# Patient Record
Sex: Female | Born: 1990 | Race: White | Hispanic: No | Marital: Married | Smoking: Never smoker
Health system: Southern US, Community
[De-identification: ages and names within clinical notes are randomized; demographics above are authoritative.]

## PROBLEM LIST (undated history)

## (undated) ENCOUNTER — Inpatient Hospital Stay (HOSPITAL_COMMUNITY): Payer: Self-pay

## (undated) DIAGNOSIS — E662 Morbid (severe) obesity with alveolar hypoventilation: Secondary | ICD-10-CM

## (undated) DIAGNOSIS — K859 Acute pancreatitis without necrosis or infection, unspecified: Secondary | ICD-10-CM

## (undated) DIAGNOSIS — K8050 Calculus of bile duct without cholangitis or cholecystitis without obstruction: Secondary | ICD-10-CM

## (undated) DIAGNOSIS — N390 Urinary tract infection, site not specified: Secondary | ICD-10-CM

## (undated) DIAGNOSIS — R202 Paresthesia of skin: Secondary | ICD-10-CM

## (undated) DIAGNOSIS — K863 Pseudocyst of pancreas: Secondary | ICD-10-CM

## (undated) DIAGNOSIS — E559 Vitamin D deficiency, unspecified: Secondary | ICD-10-CM

## (undated) DIAGNOSIS — F329 Major depressive disorder, single episode, unspecified: Secondary | ICD-10-CM

## (undated) DIAGNOSIS — N183 Chronic kidney disease, stage 3 (moderate): Secondary | ICD-10-CM

## (undated) DIAGNOSIS — F32A Depression, unspecified: Secondary | ICD-10-CM

## (undated) DIAGNOSIS — E119 Type 2 diabetes mellitus without complications: Secondary | ICD-10-CM

## (undated) DIAGNOSIS — I10 Essential (primary) hypertension: Secondary | ICD-10-CM

## (undated) HISTORY — PX: CHOLECYSTECTOMY: SHX55

## (undated) HISTORY — DX: Pseudocyst of pancreas: K86.3

## (undated) HISTORY — DX: Type 2 diabetes mellitus without complications: E11.9

## (undated) HISTORY — DX: Paresthesia of skin: R20.2

## (undated) HISTORY — DX: Vitamin D deficiency, unspecified: E55.9

## (undated) HISTORY — DX: Essential (primary) hypertension: I10

---
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
## 2014-02-17 ENCOUNTER — Encounter (HOSPITAL_COMMUNITY): Payer: Self-pay | Admitting: Pharmacy Technician

## 2014-02-26 ENCOUNTER — Encounter (HOSPITAL_COMMUNITY)
Admission: RE | Admit: 2014-02-26 | Discharge: 2014-02-26 | Disposition: A | Discharge status: Home / Self Care | Payer: Medicaid Other | Source: Ambulatory Visit | Attending: Oral Surgery | Admitting: Oral Surgery

## 2014-02-26 ENCOUNTER — Encounter (HOSPITAL_COMMUNITY): Payer: Self-pay

## 2014-02-26 DIAGNOSIS — Z01812 Encounter for preprocedural laboratory examination: Secondary | ICD-10-CM | POA: Insufficient documentation

## 2014-02-26 DIAGNOSIS — Z01818 Encounter for other preprocedural examination: Secondary | ICD-10-CM | POA: Diagnosis not present

## 2014-02-26 LAB — CBC
HEMATOCRIT: 40.8 % (ref 36.0–46.0)
Hemoglobin: 13.6 g/dL (ref 12.0–15.0)
MCH: 28.7 pg (ref 26.0–34.0)
MCHC: 33.3 g/dL (ref 30.0–36.0)
MCV: 86.1 fL (ref 78.0–100.0)
Platelets: 225 10*3/uL (ref 150–400)
RBC: 4.74 MIL/uL (ref 3.87–5.11)
RDW: 13.9 % (ref 11.5–15.5)
WBC: 7.6 10*3/uL (ref 4.0–10.5)

## 2014-02-26 LAB — HCG, SERUM, QUALITATIVE: Preg, Serum: NEGATIVE

## 2014-02-26 NOTE — H&P (Signed)
HISTORY AND PHYSICAL  Sharon BeltonKayla Duncan is a 23 y.o. female patient with CC: painful wisdom teeth  No diagnosis found.  No past medical history on file.  No current facility-administered medications for this encounter.   No current outpatient prescriptions on file.   No Known Allergies Active Problems:   * No active hospital problems. *  Vitals: There were no vitals taken for this visit. Lab results: Results for orders placed during the hospital encounter of 02/26/14 (from the past 24 hour(s))  HCG, SERUM, QUALITATIVE     Status: None   Collection Time    02/26/14  9:22 AM      Result Value Ref Range   Preg, Serum NEGATIVE  NEGATIVE  CBC     Status: None   Collection Time    02/26/14  9:22 AM      Result Value Ref Range   WBC 7.6  4.0 - 10.5 K/uL   RBC 4.74  3.87 - 5.11 MIL/uL   Hemoglobin 13.6  12.0 - 15.0 g/dL   HCT 16.140.8  09.636.0 - 04.546.0 %   MCV 86.1  78.0 - 100.0 fL   MCH 28.7  26.0 - 34.0 pg   MCHC 33.3  30.0 - 36.0 g/dL   RDW 40.913.9  81.111.5 - 91.415.5 %   Platelets 225  150 - 400 K/uL   Radiology Results: No results found. General appearance: alert, cooperative and morbidly obese Head: Normocephalic, without obvious abnormality, atraumatic Eyes: negative Nose: Nares normal. Septum midline. Mucosa normal. No drainage or sinus tenderness. Throat: lips, mucosa, and tongue normal; teeth and gums normal and impacted teeth # 1, 16, 17, 32 Neck: no adenopathy, supple, symmetrical, trachea midline and thyroid not enlarged, symmetric, no tenderness/mass/nodules Resp: clear to auscultation bilaterally Cardio: regular rate and rhythm, S1, S2 normal, no murmur, click, rub or gallop  Assessment:Impacted teeth 1, 16, 17, 32. Morbid obesity   Plan:Extraction impacted teeth 1, 16, 17, 32. GA Day surgery.   Yanely Mast M 02/26/2014

## 2014-03-02 MED ORDER — DEXTROSE 5 % IV SOLN
3.0000 g | INTRAVENOUS | Status: AC
  Administered 2014-03-03: 3 g via INTRAVENOUS
  Filled 2014-03-02: qty 3000

## 2014-03-03 ENCOUNTER — Encounter (HOSPITAL_COMMUNITY): Payer: Medicaid Other | Admitting: Anesthesiology

## 2014-03-03 ENCOUNTER — Ambulatory Visit (HOSPITAL_COMMUNITY)
Admission: RE | Admit: 2014-03-03 | Discharge: 2014-03-03 | Disposition: A | Discharge status: Home / Self Care | Payer: Medicaid Other | Source: Ambulatory Visit | Attending: Oral Surgery | Admitting: Oral Surgery

## 2014-03-03 ENCOUNTER — Encounter (HOSPITAL_COMMUNITY): Payer: Self-pay | Admitting: *Deleted

## 2014-03-03 ENCOUNTER — Encounter (HOSPITAL_COMMUNITY)
Admission: RE | Disposition: A | Discharge status: Home / Self Care | Payer: Self-pay | Source: Ambulatory Visit | Attending: Oral Surgery

## 2014-03-03 ENCOUNTER — Ambulatory Visit (HOSPITAL_COMMUNITY): Payer: Medicaid Other | Admitting: Anesthesiology

## 2014-03-03 DIAGNOSIS — K006 Disturbances in tooth eruption: Secondary | ICD-10-CM | POA: Insufficient documentation

## 2014-03-03 DIAGNOSIS — K011 Impacted teeth: Secondary | ICD-10-CM

## 2014-03-03 HISTORY — PX: TOOTH EXTRACTION: SHX859

## 2014-03-03 SURGERY — EXTRACTION, TOOTH, MOLAR
Anesthesia: General | Site: Mouth

## 2014-03-03 MED ORDER — MIDAZOLAM HCL 5 MG/5ML IJ SOLN
INTRAMUSCULAR | Status: DC | PRN
  Administered 2014-03-03: 2 mg via INTRAVENOUS

## 2014-03-03 MED ORDER — LACTATED RINGERS IV SOLN
INTRAVENOUS | Status: DC
  Administered 2014-03-03: 50 mL/h via INTRAVENOUS

## 2014-03-03 MED ORDER — ARTIFICIAL TEARS OP OINT
TOPICAL_OINTMENT | OPHTHALMIC | Status: DC | PRN
  Administered 2014-03-03: 1 via OPHTHALMIC

## 2014-03-03 MED ORDER — OXYCODONE-ACETAMINOPHEN 5-325 MG PO TABS: 1.0000 | ORAL_TABLET | ORAL | Status: DC | PRN

## 2014-03-03 MED ORDER — LACTATED RINGERS IV SOLN
INTRAVENOUS | Status: DC | PRN
  Administered 2014-03-03: 10:00:00 via INTRAVENOUS

## 2014-03-03 MED ORDER — PROPOFOL 10 MG/ML IV BOLUS
INTRAVENOUS | Status: AC
  Filled 2014-03-03: qty 20

## 2014-03-03 MED ORDER — GLYCOPYRROLATE 0.2 MG/ML IJ SOLN
INTRAMUSCULAR | Status: AC
  Filled 2014-03-03: qty 2

## 2014-03-03 MED ORDER — DEXAMETHASONE SODIUM PHOSPHATE 4 MG/ML IJ SOLN
INTRAMUSCULAR | Status: AC
  Filled 2014-03-03: qty 2

## 2014-03-03 MED ORDER — FENTANYL CITRATE 0.05 MG/ML IJ SOLN
INTRAMUSCULAR | Status: DC | PRN
  Administered 2014-03-03: 50 ug via INTRAVENOUS
  Administered 2014-03-03: 100 ug via INTRAVENOUS
  Administered 2014-03-03: 50 ug via INTRAVENOUS

## 2014-03-03 MED ORDER — ARTIFICIAL TEARS OP OINT
TOPICAL_OINTMENT | OPHTHALMIC | Status: AC
  Filled 2014-03-03: qty 3.5

## 2014-03-03 MED ORDER — SUCCINYLCHOLINE CHLORIDE 20 MG/ML IJ SOLN
INTRAMUSCULAR | Status: DC | PRN
  Administered 2014-03-03: 100 mg via INTRAVENOUS

## 2014-03-03 MED ORDER — ALBUTEROL SULFATE HFA 108 (90 BASE) MCG/ACT IN AERS
INHALATION_SPRAY | RESPIRATORY_TRACT | Status: AC
  Filled 2014-03-03: qty 6.7

## 2014-03-03 MED ORDER — 0.9 % SODIUM CHLORIDE (POUR BTL) OPTIME
TOPICAL | Status: DC | PRN
  Administered 2014-03-03: 1000 mL

## 2014-03-03 MED ORDER — LIDOCAINE-EPINEPHRINE 2 %-1:100000 IJ SOLN
INTRAMUSCULAR | Status: DC | PRN
  Administered 2014-03-03: 20 mL via INTRADERMAL

## 2014-03-03 MED ORDER — ALBUTEROL SULFATE HFA 108 (90 BASE) MCG/ACT IN AERS
INHALATION_SPRAY | RESPIRATORY_TRACT | Status: DC | PRN
  Administered 2014-03-03: 6 via RESPIRATORY_TRACT

## 2014-03-03 MED ORDER — HYDROMORPHONE HCL PF 1 MG/ML IJ SOLN: 0.2500 mg | INTRAMUSCULAR | Status: DC | PRN

## 2014-03-03 MED ORDER — LIDOCAINE HCL (CARDIAC) 20 MG/ML IV SOLN
INTRAVENOUS | Status: DC | PRN
  Administered 2014-03-03: 80 mg via INTRAVENOUS

## 2014-03-03 MED ORDER — PROPOFOL 10 MG/ML IV BOLUS
INTRAVENOUS | Status: DC | PRN
  Administered 2014-03-03: 170 mg via INTRAVENOUS
  Administered 2014-03-03: 30 mg via INTRAVENOUS

## 2014-03-03 MED ORDER — LIDOCAINE HCL (CARDIAC) 20 MG/ML IV SOLN
INTRAVENOUS | Status: AC
  Filled 2014-03-03: qty 5

## 2014-03-03 MED ORDER — FENTANYL CITRATE 0.05 MG/ML IJ SOLN
INTRAMUSCULAR | Status: AC
  Filled 2014-03-03: qty 5

## 2014-03-03 MED ORDER — SODIUM CHLORIDE 0.9 % IR SOLN
Status: DC | PRN
  Administered 2014-03-03: 1000 mL

## 2014-03-03 MED ORDER — ONDANSETRON HCL 4 MG PO TABS: 4.0000 mg | ORAL_TABLET | Freq: Three times a day (TID) | ORAL | Status: DC | PRN

## 2014-03-03 MED ORDER — MIDAZOLAM HCL 2 MG/2ML IJ SOLN
INTRAMUSCULAR | Status: AC
  Filled 2014-03-03: qty 2

## 2014-03-03 MED ORDER — ONDANSETRON HCL 4 MG/2ML IJ SOLN
INTRAMUSCULAR | Status: DC | PRN
  Administered 2014-03-03: 4 mg via INTRAVENOUS

## 2014-03-03 MED ORDER — DEXAMETHASONE SODIUM PHOSPHATE 4 MG/ML IJ SOLN
INTRAMUSCULAR | Status: DC | PRN
  Administered 2014-03-03: 8 mg via INTRAVENOUS

## 2014-03-03 MED ORDER — ONDANSETRON HCL 4 MG/2ML IJ SOLN
INTRAMUSCULAR | Status: AC
  Filled 2014-03-03: qty 2

## 2014-03-03 MED ORDER — OXYMETAZOLINE HCL 0.05 % NA SOLN
NASAL | Status: DC | PRN
  Administered 2014-03-03: 1 via NASAL

## 2014-03-03 MED ORDER — ROCURONIUM BROMIDE 50 MG/5ML IV SOLN
INTRAVENOUS | Status: AC
  Filled 2014-03-03: qty 1

## 2014-03-03 SURGICAL SUPPLY — 32 items
BLADE 10 SAFETY STRL DISP (BLADE) ×3 IMPLANT
BUR CROSS CUT (BURR) ×2
BUR CROSS CUT FISSURE 1.6 (BURR) ×2 IMPLANT
BUR CROSS CUT FISSURE 1.6MM (BURR) ×1
BUR SRG MED 1.6XXCUT FSSR (BURR) ×1 IMPLANT
BURR SRG MED 1.6XXCUT FSSR (BURR) ×1
CANISTER SUCTION 2500CC (MISCELLANEOUS) ×3 IMPLANT
COVER SURGICAL LIGHT HANDLE (MISCELLANEOUS) ×3 IMPLANT
GAUZE PACKING FOLDED 2  STR (GAUZE/BANDAGES/DRESSINGS) ×2
GAUZE PACKING FOLDED 2 STR (GAUZE/BANDAGES/DRESSINGS) ×1 IMPLANT
GAUZE SPONGE 4X4 16PLY XRAY LF (GAUZE/BANDAGES/DRESSINGS) IMPLANT
GLOVE BIO SURGEON STRL SZ 6.5 (GLOVE) ×2 IMPLANT
GLOVE BIO SURGEON STRL SZ7.5 (GLOVE) ×3 IMPLANT
GLOVE BIO SURGEONS STRL SZ 6.5 (GLOVE) ×1
GLOVE BIOGEL PI IND STRL 7.0 (GLOVE) ×1 IMPLANT
GLOVE BIOGEL PI INDICATOR 7.0 (GLOVE) ×2
GOWN STRL REUS W/ TWL LRG LVL3 (GOWN DISPOSABLE) ×1 IMPLANT
GOWN STRL REUS W/ TWL XL LVL3 (GOWN DISPOSABLE) ×1 IMPLANT
GOWN STRL REUS W/TWL LRG LVL3 (GOWN DISPOSABLE) ×2
GOWN STRL REUS W/TWL XL LVL3 (GOWN DISPOSABLE) ×2
KIT BASIN OR (CUSTOM PROCEDURE TRAY) ×3 IMPLANT
KIT ROOM TURNOVER OR (KITS) ×3 IMPLANT
NEEDLE 22X1 1/2 (OR ONLY) (NEEDLE) ×3 IMPLANT
NS IRRIG 1000ML POUR BTL (IV SOLUTION) ×3 IMPLANT
PAD ARMBOARD 7.5X6 YLW CONV (MISCELLANEOUS) ×6 IMPLANT
SUT CHROMIC 3 0 PS 2 (SUTURE) ×6 IMPLANT
TOWEL OR 17X26 10 PK STRL BLUE (TOWEL DISPOSABLE) ×3 IMPLANT
TRAY ENT MC OR (CUSTOM PROCEDURE TRAY) ×3 IMPLANT
TUBE CONNECTING 12'X1/4 (SUCTIONS)
TUBE CONNECTING 12X1/4 (SUCTIONS) IMPLANT
TUBING IRRIGATION (MISCELLANEOUS) IMPLANT
YANKAUER SUCT BULB TIP NO VENT (SUCTIONS) ×3 IMPLANT

## 2014-03-03 NOTE — H&P (Signed)
H&P documentation  -History and Physical Reviewed  -Patient has been re-examined  -No change in the plan of care  [name]  

## 2014-03-03 NOTE — Op Note (Signed)
03/03/2014  10:57 AM  PATIENT:  Sharon Duncan  23 y.o. female  PRE-OPERATIVE DIAGNOSIS:  Impacted wisdom teeth 1, 16, 17, 32, morbid obesity  POST-OPERATIVE DIAGNOSIS:  SAME  PROCEDURE:  Procedure(s): EXTRACTION OF WISDOM TEETH 1, 16, 17, 32  SURGEON:  Surgeon(s): Sharon Duncan, DDS  ANESTHESIA:   local and general  EBL:  minimal  DRAINS: none   SPECIMEN:  No Specimen  COUNTS:  YES  PLAN OF CARE: Discharge to home after PACU  PATIENT DISPOSITION:  PACU - hemodynamically stable.   PROCEDURE DETAILS: Dictation # [id number]  Sharon LopesScott M. Shandi Godfrey, DMD 03/03/2014 10:57 AM

## 2014-03-03 NOTE — Anesthesia Postprocedure Evaluation (Signed)
  Anesthesia Post-op Note  Patient: Graciella BeltonKayla Galasso  Procedure(s) Performed: Procedure(s): EXTRACTION OF WISDOM TEETH (N/A)  Patient Location: PACU  Anesthesia Type:General  Level of Consciousness: awake  Airway and Oxygen Therapy: Patient Spontanous Breathing  Post-op Pain: mild  Post-op Assessment: Post-op Vital signs reviewed  Post-op Vital Signs: Reviewed  Last Vitals:  Filed Vitals:   03/03/14 1109  BP: 117/59  Pulse: 106  Temp: 36.8 C  Resp: 20    Complications: No apparent anesthesia complications

## 2014-03-03 NOTE — Transfer of Care (Signed)
Immediate Anesthesia Transfer of Care Note  Patient: Sharon Duncan  Procedure(s) Performed: Procedure(s): EXTRACTION OF WISDOM TEETH (N/A)  Patient Location: PACU  Anesthesia Type:General  Level of Consciousness: awake, alert, and responsive  Airway & Oxygen Therapy: Patient Spontanous Breathing and Patient connected to face mask oxygen  Post-op Assessment: Report given to PACU RN, Post -op Vital signs reviewed and stable and Patient moving all extremities  Post vital signs: Reviewed and stable  Complications: No apparent anesthesia complications

## 2014-03-03 NOTE — Anesthesia Preprocedure Evaluation (Signed)
Anesthesia Evaluation  Patient identified by MRN, date of birth, ID band Patient awake    Reviewed: Allergy & Precautions, H&P , NPO status , Patient's Chart, lab work & pertinent test results  Airway Mallampati: II      Dental   Pulmonary neg pulmonary ROS,  breath sounds clear to auscultation        Cardiovascular negative cardio ROS  Rhythm:Regular Rate:Normal     Neuro/Psych    GI/Hepatic negative GI ROS, Neg liver ROS,   Endo/Other    Renal/GU      Musculoskeletal   Abdominal   Peds  Hematology   Anesthesia Other Findings   Reproductive/Obstetrics                           Anesthesia Physical Anesthesia Plan  ASA: II  Anesthesia Plan: General   Post-op Pain Management:    Induction: Intravenous  Airway Management Planned: Oral ETT  Additional Equipment:   Intra-op Plan:   Post-operative Plan: Extubation in OR  Informed Consent: I have reviewed the patients History and Physical, chart, labs and discussed the procedure including the risks, benefits and alternatives for the proposed anesthesia with the patient or authorized representative who has indicated his/her understanding and acceptance.   Dental advisory given  Plan Discussed with: CRNA  Anesthesia Plan Comments:         Anesthesia Quick Evaluation

## 2014-03-03 NOTE — Op Note (Signed)
NAMPhylliss Blakes:  Duncan, Sharon               ACCOUNT NO.:  [id number]  MEDICAL RECORD NO.:  [id number]  LOCATION:  [facility]                         FACILITY:  [facility]  PHYSICIAN:  Georgia LopesScott M. Karel Turpen, M.D.  DATE OF BIRTH:  1991/04/02  DATE OF PROCEDURE:  03/03/2014 DATE OF DISCHARGE:  03/03/2014                              OPERATIVE REPORT   PREOPERATIVE DIAGNOSES:  Impacted teeth #1, 16, 17, 32.  Morbid obesity.  POSTOPERATIVE DIAGNOSES:  Impacted teeth #1, 16, 17, 32.  Morbid obesity.  PROCEDURE:  Extraction of teeth #1, 16, 17, 32.  SURGEON:  Georgia LopesScott M. Deon Duer, MD  ANESTHESIA:  General oral intubation.  DESCRIPTION OF PROCEDURE:  The patient was taken to the operating room, placed on the table in supine position.  General anesthesia was administered intravenously and an oral endotracheal tube was placed and secured.  The eyes were protected.  The patient was draped for the procedure.  Time-out was performed.  The posterior pharynx was suctioned.  Throat pack was placed.  A 2% lidocaine with 1:100,000 epinephrine was infiltrated in the inferior alveolar block on the right and left side and a buccal and palatal infiltration around the maxillary third molars.  A total of 14 mL was utilized.  A bite block was placed in the right side of the mouth and a sweetheart retractor was used to retract the tongue.  A #15 blade was used to make an incision overlying tooth #17 and carrying forward to the embrasure between teeth #18 and 19 and this was reflected as well in the maxilla.  The 15 blade was used to make an incision around tooth #16.  The periosteum was reflected from around tooth #16 and the periosteal flap was reflected over tooth #17. The bone was removed and the tooth was identified and the tooth was sectioned and removed in multiple pieces with a 301 elevator.  Tooth #16 was elevated with a 301 elevator and removed from the mouth with the universal upper forceps.  Then, the left sockets were  curetted, irrigated, and closed with 3-0 chromic.  The endotracheal tube was repositioned on the left side of the mouth and a sweetheart retractor and bite block were repositioned as well.  Attention was turned to teeth #1 and 32.  A 15 blade was used to make an incision with a full- thickness incision overlying tooth #32 and around tooth #1.  The periosteum was then reflected around #1 with the periosteal elevator and a full-thickness periosteal flap was reflected overlying tooth #32. Then, bone was removed to expose tooth #32, and the tooth was sectioned and removed in multiple pieces with a 301 elevator.  Tooth #1 was elevated and removed with a 301 elevator.  The sockets on the right side were then irrigated, curetted, and closed with 3-0 chromic.  The oral cavity was inspected and irrigated and suctioned.  The throat pack was removed.  The patient was awakened and taken to recovery room breathing spontaneously in good condition.  ESTIMATED BLOOD LOSS:  Minimal.  COMPLICATIONS:  None.  SPECIMENS:  None.     Georgia LopesScott M. Kylor Valverde, M.D.     SMJ/MEDQ  D:  03/03/2014  T:  03/03/2014  Job:  [id number]

## 2014-03-03 NOTE — Discharge Instructions (Signed)
What to Eat after Tooth extraction:   ° ° °For your first meals, you should eat lightly; only small meals at first.   Avoid Sharp, Crunchy, and Hot foods.   If you do not have nausea, you may eat larger meals.  Avoid spicy, greasy and heavy food, as these may make you sick after the anesthesia.  °. ° °Sinus precautions after surgery:  °1. Avoid blowing your nose.  °2. Avoid sneezing. If you might sneeze, Keep her mouth open and do not pinch your nose closed.  °3. Avoid sucking on straws. Avoid smoking.  °4. Do not lift objects weighing more than 20 pounds  °Call if questions arise.  ° °MOUTH CARE AFTER SURGERY  °FACTS:  °Ice used in ice bag helps keep the swelling down, and can help lessen the pain.  °It is easier to treat pain BEFORE it happens.  °Spitting disturbs the clot and may cause bleeding to start again, or to get worse.  °Smoking delays healing and can cause complications.  °Sharing prescriptions can be dangerous. Do not take medications not recently prescribed for you.  °Antibiotics may stop birth control pills from working. Use other means of birth control while on antibiotics.  °Warm salt water rinses after the first 24 hours will help lessen the swelling: Use 1/2 teaspoonful of table salt per oz.of water. °DO NOT:  °Do not spit. Do not drink through a straw.  °Strongly advised not to smoke, dip snuff or chew tobacco at least for 3 days.  °Do not eat sharp or crunchy foods. Avoid the area of surgery when chewing.  °Do not stop your antibiotics before your instructions say to do so.  °Do not eat hot foods until bleeding has stopped. If you need to, let your food cool down to room temperature. °EXPECT:  °Some swelling, especially first 2-3 days.  °Soreness or discomfort in varying degrees. Follow your dentist's instructions about how to handle pain before it starts.  °Pinkish saliva or light blood in saliva, or on your pillow in the morning. This can last around 24 hours.  °Bruising inside or outside the  mouth. This may not show up until 2-3 days after surgery. Don't worry, it will go away in time.  °Pieces of "bone" may work themselves loose. It's OK. If they bother you, let us know. °WHAT TO DO IMMEDIATELY AFTER SURGERY:  °Bite on the gauze with steady pressure for 1-2 hours. Don't chew on the gauze.  °Do not lie down flat. Raise your head support especially for the first 24 hours.  °Apply ice to your face on the side of the surgery. You may apply it 20 minutes on and a few minutes off. Ice for 8-12 hours. You may use ice up to 24 hours.  °Before the numbness wears off, take a pain pill as instructed.  °Prescription pain medication is not always required. °SWELLING:  °Expect swelling for the first couple of days. It should get better after that.  °If swelling increases 3 days or so after surgery; let us know as soon as possible. °FEVER:  °Take Tylenol every 4 hours if needed to lower your temperature, especially if it is at 100F or higher.  °Drink lots of fluids.  °If the fever does not go away, let us know. °BREATHING TROUBLE:  °Any unusual difficulty breathing means you have to have someone bring you to the emergency room ASAP °BLEEDING:  °Light oozing is expected for 24 hours or so.  °Prop head up   with pillows  °Avoid spitting  °Do not confuse bright red fresh flowing blood with lots of saliva colored with a little bit of blood.  °If you notice some bleeding, place gauze or a tea bag where it is bleeding and apply CONSTANT pressure by biting down for 1 hour. Avoid talking during this time. Do not remove the gauze or tea bag during this hour to "check" the bleeding.  °If you notice bright RED bleeding FLOWING out of particular area, and filling the floor of your mouth, put a wad of gauze on that area, bite down firmly and constantly. Call us immediately. If we're closed, have someone bring you to the emergency room. °ORAL HYGIENE:  °Brush your teeth as usual after meals and before bedtime.  °Use a soft  toothbrush around the area of surgery.  °DO NOT AVOID BRUSHING. Otherwise bacteria(germs) will grow and may delay healing or encourage infection.  °Since you cannot spit, just gently rinse and let the water flow out of your mouth.  °DO NOT SWISH HARD. °EATING:  °Cool liquids are a good point to start. Increase to soft foods as tolerated. °PRESCRIPTIONS:  °Follow the directions for your prescriptions exactly as written.  °If your doctor gave you a narcotic pain medication, do not drive, operate machinery or drink alcohol when on that medication. °QUESTIONS:  °Call our office during office hours ° ° °

## 2014-03-05 ENCOUNTER — Encounter (HOSPITAL_COMMUNITY): Payer: Self-pay | Admitting: Oral Surgery

## 2014-05-21 ENCOUNTER — Encounter (HOSPITAL_COMMUNITY): Payer: Self-pay | Admitting: Emergency Medicine

## 2014-05-21 ENCOUNTER — Emergency Department (HOSPITAL_COMMUNITY)
Admission: EM | Admit: 2014-05-21 | Discharge: 2014-05-22 | Disposition: A | Discharge status: Home / Self Care | Payer: MEDICAID | Attending: Emergency Medicine | Admitting: Emergency Medicine

## 2014-05-21 DIAGNOSIS — Z046 Encounter for general psychiatric examination, requested by authority: Secondary | ICD-10-CM | POA: Diagnosis present

## 2014-05-21 DIAGNOSIS — Z3202 Encounter for pregnancy test, result negative: Secondary | ICD-10-CM | POA: Insufficient documentation

## 2014-05-21 DIAGNOSIS — F32A Depression, unspecified: Secondary | ICD-10-CM

## 2014-05-21 DIAGNOSIS — F329 Major depressive disorder, single episode, unspecified: Secondary | ICD-10-CM | POA: Diagnosis not present

## 2014-05-21 HISTORY — DX: Major depressive disorder, single episode, unspecified: F32.9

## 2014-05-21 HISTORY — DX: Depression, unspecified: F32.A

## 2014-05-21 LAB — COMPREHENSIVE METABOLIC PANEL
ALBUMIN: 4.2 g/dL (ref 3.5–5.2)
ALK PHOS: 98 U/L (ref 39–117)
ALT: 22 U/L (ref 0–35)
ANION GAP: 14 (ref 5–15)
AST: 20 U/L (ref 0–37)
BILIRUBIN TOTAL: 0.6 mg/dL (ref 0.3–1.2)
BUN: 17 mg/dL (ref 6–23)
CO2: 27 mEq/L (ref 19–32)
Calcium: 9.8 mg/dL (ref 8.4–10.5)
Chloride: 102 mEq/L (ref 96–112)
Creatinine, Ser: 1.31 mg/dL — ABNORMAL HIGH (ref 0.50–1.10)
GFR calc Af Amer: 67 mL/min — ABNORMAL LOW (ref >90)
GFR calc non Af Amer: 58 mL/min — ABNORMAL LOW (ref >90)
Glucose, Bld: 85 mg/dL (ref 70–99)
POTASSIUM: 4.2 meq/L (ref 3.7–5.3)
Sodium: 143 mEq/L (ref 137–147)
TOTAL PROTEIN: 8.5 g/dL — AB (ref 6.0–8.3)

## 2014-05-21 LAB — CBC
HCT: 39.8 % (ref 36.0–46.0)
Hemoglobin: 13.6 g/dL (ref 12.0–15.0)
MCH: 29.2 pg (ref 26.0–34.0)
MCHC: 34.2 g/dL (ref 30.0–36.0)
MCV: 85.6 fL (ref 78.0–100.0)
PLATELETS: 264 10*3/uL (ref 150–400)
RBC: 4.65 MIL/uL (ref 3.87–5.11)
RDW: 13.5 % (ref 11.5–15.5)
WBC: 10.9 10*3/uL — ABNORMAL HIGH (ref 4.0–10.5)

## 2014-05-21 LAB — RAPID URINE DRUG SCREEN, HOSP PERFORMED
Amphetamines: NOT DETECTED
BARBITURATES: NOT DETECTED
BENZODIAZEPINES: NOT DETECTED
COCAINE: NOT DETECTED
OPIATES: NOT DETECTED
TETRAHYDROCANNABINOL: NOT DETECTED

## 2014-05-21 LAB — SALICYLATE LEVEL: Salicylate Lvl: <2 mg/dL — ABNORMAL LOW (ref 2.8–20.0)

## 2014-05-21 LAB — ACETAMINOPHEN LEVEL

## 2014-05-21 LAB — ETHANOL: Alcohol, Ethyl (B): <11 mg/dL (ref 0–11)

## 2014-05-21 NOTE — ED Notes (Addendum)
Pt reports taking 4 Benadryl with intent to kill herself.  Reports history of depression, denies medication. Reports that she had been seeing a counselor but then her Medicaid ran out.  Pt reports recently getting Medicaid reinstated.  When Pt was asked why she felt the need to harm herself, pt states "I don't know, just a bad day I guess."

## 2014-05-22 LAB — PREGNANCY, URINE: Preg Test, Ur: NEGATIVE
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
## 2014-05-22 NOTE — ED Provider Notes (Signed)
Patient rechecked by examiner. No suicidal or homicidal ideation. No psychosis. She will follow up with community mental health resources.  Donnetta HutchingBrian Aleyssa Pike, MD 05/22/14 [phone]

## 2014-05-22 NOTE — Discharge Instructions (Signed)
Follow up community mental health resources °

## 2014-05-22 NOTE — ED Provider Notes (Signed)
CSN: [id number]     Arrival date & time 05/21/14  1958 History  This chart was scribed for Loren Raceravid Fanta Wimberley, MD by Bronson CurbJacqueline Melvin, ED Scribe. This patient was seen in room APA17/[facility]17 and the patient's care was started at 12:15 AM.   Chief Complaint  Patient presents with  . V70.1    The history is provided by the patient. No language interpreter was used.    HPI Comments: Sharon Duncan is a 23 y.o. female, with history of depression, who presents to the Emergency Department for medical clearance. Patient states she was having a "bad day" and took 4 10mg  Benadryl in an attempt to kill herself at roughly 7 PM receiving. She states she feels fine and is just "sleepy". She reports history of self injury, and states she cut herself a long time ago. She denies any A/V hallucinations and has not taken any other medications with the Benadryl.   Past Medical History  Diagnosis Date  . Depression    Past Surgical History  Procedure Laterality Date  . No past surgeries    . Tooth extraction N/A 03/03/2014    Procedure: EXTRACTION OF WISDOM TEETH;  Surgeon: Georgia LopesScott M Jensen, DDS;  [facility];  Service: Oral Surgery;  Laterality: N/A;   History reviewed. No pertinent family history. History  Substance Use Topics  . Smoking status: Never Smoker   . Smokeless tobacco: Not on file  . Alcohol Use: Yes     Comment: occ   OB History   Grav Para Term Preterm Abortions TAB SAB Ect Mult Living                 Review of Systems  Constitutional: Negative for fever and chills.  Respiratory: Negative for cough and shortness of breath.   Cardiovascular: Negative for chest pain.  Gastrointestinal: Negative for nausea, vomiting and abdominal pain.  Musculoskeletal: Negative for back pain, neck pain and neck stiffness.  Skin: Negative for rash and wound.  Neurological: Negative for dizziness, weakness, light-headedness, numbness and headaches.  Psychiatric/Behavioral: Positive for suicidal ideas  and dysphoric mood.  All other systems reviewed and are negative.     Allergies  Review of patient's allergies indicates no known allergies.  Home Medications   Prior to Admission medications   Medication Sig Start Date End Date Taking? Authorizing Provider  DiphenhydrAMINE HCl (ALLERGY MED PO) Take 1 tablet by mouth daily as needed (for allergies).   Yes Historical Provider, MD   Triage Vitals: BP 106/70  Pulse 108  Temp(Src) 98.8 F (37.1 C) (Oral)  Resp 18  Ht 5\' 3"  (1.6 m)  Wt 270 lb (122.471 kg)  BMI 47.84 kg/m2  SpO2 99%  LMP 05/08/2014  Physical Exam  Nursing note and vitals reviewed. Constitutional: She is oriented to person, place, and time. She appears well-developed and well-nourished. No distress.  HENT:  Head: Normocephalic and atraumatic.  Mouth/Throat: Oropharynx is clear and moist.  Eyes: EOM are normal. Pupils are equal, round, and reactive to light.  Neck: Normal range of motion. Neck supple.  Cardiovascular: Normal rate and regular rhythm.   Pulmonary/Chest: Effort normal and breath sounds normal. No respiratory distress. She has no wheezes. She has no rales. She exhibits no tenderness.  Abdominal: Soft. Bowel sounds are normal. She exhibits no distension and no mass. There is no tenderness. There is no rebound and no guarding.  Musculoskeletal: Normal range of motion. She exhibits no edema and no tenderness.  Neurological: She is alert and  oriented to person, place, and time.  Moves all extremities without deficit. Sensation is grossly intact.  Skin: Skin is warm and dry. No rash noted. No erythema.  Psychiatric:  SI. No HI, visual or auditory hallucination.    ED Course  Procedures (including critical care time)  DIAGNOSTIC STUDIES: Oxygen Saturation is 99% on room air, normal by my interpretation.    COORDINATION OF CARE: At 0017 Discussed treatment plan with patient which includes labs. Patient agrees.   Labs Review Labs Reviewed  CBC -  Abnormal; Notable for the following:    WBC 10.9 (*)    All other components within normal limits  COMPREHENSIVE METABOLIC PANEL - Abnormal; Notable for the following:    Creatinine, Ser 1.31 (*)    Total Protein 8.5 (*)    GFR calc non Af Amer 58 (*)    GFR calc Af Amer 67 (*)    All other components within normal limits  SALICYLATE LEVEL - Abnormal; Notable for the following:    Salicylate Lvl <2.0 (*)    All other components within normal limits  ACETAMINOPHEN LEVEL  ETHANOL  URINE RAPID DRUG SCREEN (HOSP PERFORMED)    Imaging Review No results found.   EKG Interpretation None      MDM   Final diagnoses:  None    I personally performed the services described in this documentation, which was scribed in my presence. The recorded information has been reviewed and is accurate. Patient is medically cleared for psychiatric evaluation. She is voluntary at this point.   Loren Raceravid Ahaan Zobrist, MD 05/23/14 [phone]

## 2014-10-11 ENCOUNTER — Encounter (HOSPITAL_COMMUNITY): Payer: Self-pay | Admitting: *Deleted

## 2014-10-11 DIAGNOSIS — R05 Cough: Secondary | ICD-10-CM | POA: Insufficient documentation

## 2014-10-11 DIAGNOSIS — Z3A12 12 weeks gestation of pregnancy: Secondary | ICD-10-CM | POA: Diagnosis not present

## 2014-10-11 DIAGNOSIS — O212 Late vomiting of pregnancy: Secondary | ICD-10-CM | POA: Diagnosis present

## 2014-10-11 DIAGNOSIS — Z79899 Other long term (current) drug therapy: Secondary | ICD-10-CM | POA: Diagnosis not present

## 2014-10-11 DIAGNOSIS — Z8659 Personal history of other mental and behavioral disorders: Secondary | ICD-10-CM | POA: Insufficient documentation

## 2014-10-11 DIAGNOSIS — R509 Fever, unspecified: Secondary | ICD-10-CM | POA: Diagnosis not present

## 2014-10-11 DIAGNOSIS — O9989 Other specified diseases and conditions complicating pregnancy, childbirth and the puerperium: Secondary | ICD-10-CM | POA: Insufficient documentation

## 2014-10-11 DIAGNOSIS — R0981 Nasal congestion: Secondary | ICD-10-CM | POA: Diagnosis not present

## 2014-10-11 NOTE — ED Notes (Signed)
Pt reports being nauseated yesterday. Started vomiting today. Pt states she is [redacted] weeks pregnant.

## 2014-10-12 ENCOUNTER — Emergency Department (HOSPITAL_COMMUNITY)
Admission: EM | Admit: 2014-10-12 | Discharge: 2014-10-12 | Disposition: A | Discharge status: Home / Self Care | Payer: Medicaid Other | Attending: Emergency Medicine | Admitting: Emergency Medicine

## 2014-10-12 DIAGNOSIS — R112 Nausea with vomiting, unspecified: Secondary | ICD-10-CM

## 2014-10-12 DIAGNOSIS — Z3490 Encounter for supervision of normal pregnancy, unspecified, unspecified trimester: Secondary | ICD-10-CM

## 2014-10-12 MED ORDER — ONDANSETRON 4 MG PO TBDP: 4.0000 mg | ORAL_TABLET | Freq: Three times a day (TID) | ORAL | Status: DC | PRN

## 2014-10-12 MED ORDER — ONDANSETRON 4 MG PO TBDP
4.0000 mg | ORAL_TABLET | Freq: Once | ORAL | Status: AC
  Administered 2014-10-12: 4 mg via ORAL
  Filled 2014-10-12: qty 1

## 2014-10-12 NOTE — ED Provider Notes (Signed)
CSN: [id number]     Arrival date & time 10/11/14  2245 History  This chart was scribed for Sharon Mulders, MD by Roxy Cedar, ED Scribe. This patient was seen in room APA12/APA12 and the patient's care was started at 12:41 AM.   Chief Complaint  Patient presents with  . Emesis   Patient is a 24 y.o. female presenting with vomiting. The history is provided by the patient. No language interpreter was used.  Emesis Severity:  Moderate Duration:  1 day Timing:  Rare Number of daily episodes:  1 Progression:  Worsening Chronicity:  New Recent urination:  Normal Relieved by:  Nothing Worsened by:  Nothing tried Ineffective treatments: Phenergan. Associated symptoms: cough and fever   Associated symptoms: no abdominal pain, no chills, no diarrhea, no headaches and no sore throat    HPI Comments: Sharon Duncan is a pregnant 24 y.o. female, grava 3 para 2, who presents to the Emergency Department complaining of moderate nausea and vomiting that began earlier today. Patient is being seen by [facility] center in [place], [place]. Patient's LNMP was 12/23. Patient has first ultrasound appointment in a few days. Patient states that this is her 4th pregnancy. She states that she woke up at 12PM today and was feeling nauseous. She had something to eat and had 1 episode of emesis. She states she took Phenergan and went back to sleep. She states she woke up 4 hours later with worsening symptoms. Per husband, patient seemed to "be pale in the face" and had a decreased appetite. She denies associated body aches or abdominal pain. Per husband, patient has had 3 sick contacts with symptoms of emesis and diarrhea.  Past Medical History  Diagnosis Date  . Depression    Past Surgical History  Procedure Laterality Date  . No past surgeries    . Tooth extraction N/A 03/03/2014    Procedure: EXTRACTION OF WISDOM TEETH;  Surgeon: Georgia Lopes, DDS;  Location: [facility] OR;  Service: Oral Surgery;  Laterality: N/A;    No family history on file. History  Substance Use Topics  . Smoking status: Never Smoker   . Smokeless tobacco: Not on file  . Alcohol Use: Yes     Comment: occ   OB History    Gravida Para Term Preterm AB TAB SAB Ectopic Multiple Living   1              Review of Systems  Constitutional: Positive for fever. Negative for chills.  HENT: Positive for congestion. Negative for rhinorrhea and sore throat.   Eyes: Negative for visual disturbance.  Respiratory: Positive for cough. Negative for shortness of breath.   Cardiovascular: Negative for chest pain and leg swelling.  Gastrointestinal: Positive for nausea and vomiting. Negative for abdominal pain and diarrhea.  Genitourinary: Negative for dysuria and vaginal bleeding.  Musculoskeletal: Negative for back pain.  Skin: Negative for rash.  Neurological: Negative for headaches.  Hematological: Does not bruise/bleed easily.  Psychiatric/Behavioral: Negative for confusion.   Allergies  Review of patient's allergies indicates no known allergies.  Home Medications   Prior to Admission medications   Medication Sig Start Date End Date Taking? Authorizing Provider  nitrofurantoin (MACRODANTIN) 100 MG capsule Take 100 mg by mouth daily.   Yes Historical Provider, MD  Prenatal Vit-Fe Fumarate-FA (PRENATAL MULTIVITAMIN) TABS tablet Take 1 tablet by mouth daily at 12 noon.   Yes Historical Provider, MD  promethazine (PHENERGAN) 25 MG tablet Take 25 mg by mouth every 6 (six)  hours as needed for nausea or vomiting.   Yes Historical Provider, MD  DiphenhydrAMINE HCl (ALLERGY MED PO) Take 1 tablet by mouth daily as needed (for allergies).    Historical Provider, MD  ondansetron (ZOFRAN ODT) 4 MG disintegrating tablet Take 1 tablet (4 mg total) by mouth every 8 (eight) hours as needed. 10/12/14   Sharon MuldersScott Avenly Roberge, MD   Triage Vitals: BP 105/80 mmHg  Pulse 111  Temp(Src) 99.5 F (37.5 C) (Oral)  Resp 20  Ht 5\' 4"  (1.626 m)  Wt 273 lb  (123.832 kg)  BMI 46.84 kg/m2  SpO2 99%  LMP 05/08/2014  Physical Exam  Constitutional: She is oriented to person, place, and time. She appears well-developed and well-nourished. No distress.  HENT:  Head: Normocephalic and atraumatic.  Eyes: EOM are normal. Pupils are equal, round, and reactive to light. No scleral icterus.  Cardiovascular: Normal rate, regular rhythm and normal heart sounds.   Pulmonary/Chest: Effort normal and breath sounds normal. No respiratory distress.  Abdominal: Bowel sounds are normal. There is no tenderness.  Musculoskeletal: She exhibits no edema.  Neurological: She is alert and oriented to person, place, and time.  Skin: She is not diaphoretic.  Psychiatric: She has a normal mood and affect.  Nursing note and vitals reviewed.  ED Course  Procedures (including critical care time)  DIAGNOSTIC STUDIES: Oxygen Saturation is 99% on RA, normal by my interpretation.    COORDINATION OF CARE: 12:44 AM- Discussed plans to give patient Zofran. Pt advised of plan for treatment and pt agrees.  Labs Review Labs Reviewed - No data to display  Imaging Review No results found.   EKG Interpretation None     MDM   Final diagnoses:  Non-intractable vomiting with nausea, vomiting of unspecified type  Pregnancy    The patient is [redacted] weeks pregnant. No significant abdominal pain. Started with nausea and one episode of vomiting around 12 noon. Nausea has persisted no additional vomiting. Was taking her Phenergan that's prescribed for the pregnancy at home. Several family members have had a vomiting and diarrhea type illness. These symptoms seem to be more viral base than pregnancy base. Patient talked about having feeling of fever at home but no documented fevers. 10. Is 99.5. Patient clinically does not appear dehydrated. Will discharge home with Zofran in addition to the Phenergan and precautions.  Patient without any vaginal bleeding no abdominal pain.  I  personally performed the services described in this documentation, which was scribed in my presence. The recorded information has been reviewed and is accurate.    Sharon MuldersScott Levante Simones, MD 10/12/14 0110

## 2014-10-12 NOTE — Discharge Instructions (Signed)
Recommend small sips of fluids frequently. Take the antinausea medicine as prescribed can also take it with your Phenergan. Return for any new or worse symptoms. Follow up with your doctors if not improved by Monday.

## 2017-07-28 ENCOUNTER — Encounter (HOSPITAL_COMMUNITY): Payer: Self-pay | Admitting: Student

## 2017-07-28 ENCOUNTER — Inpatient Hospital Stay (HOSPITAL_COMMUNITY): Payer: Medicaid Other

## 2017-07-28 ENCOUNTER — Inpatient Hospital Stay (HOSPITAL_COMMUNITY)
Admission: AD | Admit: 2017-07-28 | Discharge: 2017-07-28 | Disposition: A | Discharge status: Home / Self Care | Payer: Medicaid Other | Source: Ambulatory Visit | Attending: Obstetrics & Gynecology | Admitting: Obstetrics & Gynecology

## 2017-07-28 DIAGNOSIS — Z79899 Other long term (current) drug therapy: Secondary | ICD-10-CM | POA: Insufficient documentation

## 2017-07-28 DIAGNOSIS — N8312 Corpus luteum cyst of left ovary: Secondary | ICD-10-CM | POA: Diagnosis not present

## 2017-07-28 DIAGNOSIS — O3680X0 Pregnancy with inconclusive fetal viability, not applicable or unspecified: Secondary | ICD-10-CM | POA: Diagnosis not present

## 2017-07-28 DIAGNOSIS — O3481 Maternal care for other abnormalities of pelvic organs, first trimester: Secondary | ICD-10-CM | POA: Diagnosis not present

## 2017-07-28 DIAGNOSIS — Z3491 Encounter for supervision of normal pregnancy, unspecified, first trimester: Secondary | ICD-10-CM

## 2017-07-28 DIAGNOSIS — R21 Rash and other nonspecific skin eruption: Secondary | ICD-10-CM | POA: Insufficient documentation

## 2017-07-28 DIAGNOSIS — O99711 Diseases of the skin and subcutaneous tissue complicating pregnancy, first trimester: Secondary | ICD-10-CM | POA: Insufficient documentation

## 2017-07-28 DIAGNOSIS — Z9889 Other specified postprocedural states: Secondary | ICD-10-CM | POA: Insufficient documentation

## 2017-07-28 DIAGNOSIS — Z3A12 12 weeks gestation of pregnancy: Secondary | ICD-10-CM | POA: Diagnosis not present

## 2017-07-28 DIAGNOSIS — O2341 Unspecified infection of urinary tract in pregnancy, first trimester: Secondary | ICD-10-CM

## 2017-07-28 HISTORY — DX: Urinary tract infection, site not specified: N39.0

## 2017-07-28 LAB — ABO/RH: ABO/RH(D): O NEG

## 2017-07-28 LAB — COMPREHENSIVE METABOLIC PANEL
ALBUMIN: 3.2 g/dL — AB (ref 3.5–5.0)
ALK PHOS: 76 U/L (ref 38–126)
ALT: 24 U/L (ref 14–54)
AST: 25 U/L (ref 15–41)
Anion gap: 10 (ref 5–15)
BILIRUBIN TOTAL: 0.4 mg/dL (ref 0.3–1.2)
BUN: 13 mg/dL (ref 6–20)
CALCIUM: 8.7 mg/dL — AB (ref 8.9–10.3)
CO2: 18 mmol/L — ABNORMAL LOW (ref 22–32)
CREATININE: 1.13 mg/dL — AB (ref 0.44–1.00)
Chloride: 108 mmol/L (ref 101–111)
GFR calc Af Amer: >60 mL/min (ref >60)
GFR calc non Af Amer: >60 mL/min (ref >60)
GLUCOSE: 108 mg/dL — AB (ref 65–99)
Potassium: 4 mmol/L (ref 3.5–5.1)
Sodium: 136 mmol/L (ref 135–145)
TOTAL PROTEIN: 7.6 g/dL (ref 6.5–8.1)

## 2017-07-28 LAB — CBC WITH DIFFERENTIAL/PLATELET
BASOS ABS: 0 10*3/uL (ref 0.0–0.1)
BASOS PCT: 0 %
Eosinophils Absolute: 0 10*3/uL (ref 0.0–0.7)
Eosinophils Relative: 0 %
HCT: 36 % (ref 36.0–46.0)
HEMOGLOBIN: 12.3 g/dL (ref 12.0–15.0)
LYMPHS PCT: 12 %
Lymphs Abs: 0.9 10*3/uL (ref 0.7–4.0)
MCH: 28.7 pg (ref 26.0–34.0)
MCHC: 34.2 g/dL (ref 30.0–36.0)
MCV: 84.1 fL (ref 78.0–100.0)
MONOS PCT: 1 %
Monocytes Absolute: 0.1 10*3/uL (ref 0.1–1.0)
NEUTROS ABS: 6.8 10*3/uL (ref 1.7–7.7)
NEUTROS PCT: 87 %
Platelets: 205 10*3/uL (ref 150–400)
RBC: 4.28 MIL/uL (ref 3.87–5.11)
RDW: 13.2 % (ref 11.5–15.5)
WBC: 7.8 10*3/uL (ref 4.0–10.5)

## 2017-07-28 LAB — LACTIC ACID, PLASMA: LACTIC ACID, VENOUS: 1.3 mmol/L (ref 0.5–1.9)

## 2017-07-28 LAB — HCG, QUANTITATIVE, PREGNANCY: HCG, BETA CHAIN, QUANT, S: 82372 m[IU]/mL — AB (ref <5)

## 2017-07-28 MED ORDER — PREDNISONE 20 MG PO TABS: 40.0000 mg | ORAL_TABLET | Freq: Every day | ORAL | 0 refills | Status: DC

## 2017-07-28 MED ORDER — CEPHALEXIN 500 MG PO CAPS: 500.0000 mg | ORAL_CAPSULE | Freq: Four times a day (QID) | ORAL | 0 refills | Status: DC

## 2017-07-28 NOTE — MAU Provider Note (Signed)
History     CSN: [id number]  Arrival date and time: 07/28/17 1341  None      Chief Complaint  Patient presents with  . Rash   HPI Sharon Duncan is a 27 y.o. G5P0010 at 70w2dby LMP [facility]. Patient is early [facility]; scheduled for initial prenatal care next week.  Presented to hospital d/t rash on hands & feet. [facility], & anion gap. 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  Per patient, she has had this rash for 3 days. She initially thought it was hand foot mouth. Started with mild sore throat, no oral lesions. Itching & pain of lesions on bilateral palms & soles of feet. Denies fever/chills, body aches, joint pain, abdominal pain, vaginal bleeding. No sick contacts.  [facility]. Endorses some dysuria. No hematuria or flank pain. Hx of frequent UTIs.   Past Medical History:  Diagnosis Date  . Depression   . Frequent UTI     Past Surgical History:  Procedure Laterality Date  . KIDNEY SURGERY  2004   ?to lift kidney? d/t frequent UTIs  . TOOTH EXTRACTION N/A 03/03/2014   Procedure: EXTRACTION OF WISDOM TEETH;  Surgeon: SGae Bon DDS;  [facility]  Service: Oral Surgery;  Laterality: N/A;    No family history on file.  Social History   Tobacco Use  . Smoking status: Never Smoker  Substance Use Topics  . Alcohol use: Yes    Comment: occ  . Drug use: No    Allergies: No Known Allergies  Medications Prior to Admission  Medication Sig Dispense Refill Last Dose  . acetaminophen (TYLENOL) 500 MG tablet Take 500 mg by mouth every 6 (six) hours as needed for moderate pain.   07/27/2017 at Unknown time  . DiphenhydrAMINE HCl (ALLERGY MED PO) Take 1 tablet by mouth daily as needed (for allergies).   07/27/2017 at Unknown time  . nitrofurantoin (MACRODANTIN) 100 MG  capsule Take 100 mg by mouth daily.     . ondansetron (ZOFRAN ODT) 4 MG disintegrating tablet Take 1 tablet (4 mg total) by mouth every 8 (eight) hours as needed. (Patient not taking: Reported on 07/28/2017) 10 tablet 1 Not Taking at Unknown time    Review of Systems  Constitutional: Negative.   Gastrointestinal: Negative.   Genitourinary: Positive for dysuria. Negative for flank pain, hematuria and vaginal bleeding.  Musculoskeletal: Negative.   Skin: Positive for rash.   Physical Exam   Blood pressure 123/70, pulse 81, temperature (!) 97.4 F (36.3 C), temperature source Oral, resp. rate 19, last menstrual period 05/24/2017, SpO2 100 %.  Physical Exam  Nursing note and vitals reviewed. Constitutional: She is oriented to person, place, and time. She appears well-developed and well-nourished. No distress.  HENT:  Head: Normocephalic and atraumatic.  Eyes: Conjunctivae are normal. Right eye exhibits no discharge. Left eye exhibits no discharge. No scleral icterus.  Neck: Normal range of motion.  Respiratory: Effort normal. No respiratory distress.  Neurological: She is alert and oriented to person, place, and time.  Skin: Skin is warm and dry. Rash noted. She is not diaphoretic.  Small macular lesions on bilateral palms. Blanchable.   Psychiatric: She has a normal mood and affect. Her behavior is normal. Judgment and thought content normal.    MAU Course  Procedures  Results for orders placed or performed during the hospital encounter of 07/28/17 (from the past 24 hour(s))  ABO/Rh     Status: None (Preliminary result)   Collection Time: 07/28/17  2:17 PM  Result Value Ref Range   ABO/RH(D) O NEG   hCG, quantitative, pregnancy     Status: Abnormal   Collection Time: 07/28/17  2:17 PM  Result Value Ref Range   hCG, Beta Chain, Quant, S 82,372 (H) <5 mIU/mL  Lactic acid, plasma     Status: None   Collection Time: 07/28/17  2:17 PM  Result Value Ref Range   Lactic Acid, Venous 1.3  0.5 - 1.9 mmol/L  CBC with Differential/Platelet     Status: None   Collection Time: 07/28/17  2:17 PM  Result Value Ref Range   WBC 7.8 4.0 - 10.5 K/uL   RBC 4.28 3.87 - 5.11 MIL/uL   Hemoglobin 12.3 12.0 - 15.0 g/dL   HCT 36.0 36.0 - 46.0 %   MCV 84.1 78.0 - 100.0 fL   MCH 28.7 26.0 - 34.0 pg   MCHC 34.2 30.0 - 36.0 g/dL   RDW 13.2 11.5 - 15.5 %   Platelets 205 150 - 400 K/uL   Neutrophils Relative % 87 %   Neutro Abs 6.8 1.7 - 7.7 K/uL   Lymphocytes Relative 12 %   Lymphs Abs 0.9 0.7 - 4.0 K/uL   Monocytes Relative 1 %   Monocytes Absolute 0.1 0.1 - 1.0 K/uL   Eosinophils Relative 0 %   Eosinophils Absolute 0.0 0.0 - 0.7 K/uL   Basophils Relative 0 %   Basophils Absolute 0.0 0.0 - 0.1 K/uL  Comprehensive metabolic panel     Status: Abnormal   Collection Time: 07/28/17  2:17 PM  Result Value Ref Range   Sodium 136 135 - 145 mmol/L   Potassium 4.0 3.5 - 5.1 mmol/L   Chloride 108 101 - 111 mmol/L   CO2 18 (L) 22 - 32 mmol/L   Glucose, Bld 108 (H) 65 - 99 mg/dL   BUN 13 6 - 20 mg/dL   Creatinine, Ser 1.13 (H) 0.44 - 1.00 mg/dL   Calcium 8.7 (L) 8.9 - 10.3 mg/dL   Total Protein 7.6 6.5 - 8.1 g/dL   Albumin 3.2 (L) 3.5 - 5.0 g/dL   AST 25 15 - 41 U/L   ALT 24 14 - 54 U/L   Alkaline Phosphatase 76 38 - 126 U/L   Total Bilirubin 0.4 0.3 - 1.2 mg/dL   GFR calc non Af Amer >60 >60 mL/min   GFR calc Af Amer >60 >60 mL/min   Anion gap 10 5 - 15    MDM Ultrasound shows SIUP at 70w0dwith cardiac activity CBC, CMP, lactic acid Lactic acid down to 1.3 & anion gap 10. Pt afebrile, normotensive, & no leukocytosis on CBC Dr. ERip Harbour& Dr. DLambert Ketoat bedside to assess rash. Hand foot mouth vs vasculitis. Pt stable at this time. Other labs pending & will take several days to result. Pt can f/u with her OB as scheduled. Will d/c home on steroid burst & PO abx for UTI.   Assessment and Plan  A: 1. Normal IUP (intrauterine pregnancy) on prenatal ultrasound, first trimester   2.  Pregnancy of unknown anatomic location   3. [redacted] weeks gestation of pregnancy   4. UTI (urinary tract infection) during pregnancy, first trimester   5. Rash of hands    P: Discharge home Rx keflex & prednisone  F/u with ob as scheduled Discussed return precautions  Jorje Guild 07/28/2017, 4:00 PM   OB Attending Pt seen and examined. IUP confirmed. Lab repeated lactic acid. BP normal No evidence of sepsis Rash of unclear etiology. Additional labs drawn and pending from outside hospital. Discussed with pt and husband. Will treat UTI and start on steroid taper. Pt to follow up with private OB next week as already scheduled. Instructed to return to MAU for any concerns or worsening of her condition

## 2017-07-28 NOTE — MAU Note (Signed)
Patient's IV removed from left forearm catheter tip intact.  [facility]

## 2017-07-28 NOTE — Discharge Instructions (Signed)

## 2018-02-03 ENCOUNTER — Inpatient Hospital Stay: Payer: Self-pay

## 2018-02-03 ENCOUNTER — Inpatient Hospital Stay (HOSPITAL_COMMUNITY)
Admission: EM | Admit: 2018-02-03 | Discharge: 2018-02-08 | DRG: 439 | Disposition: A | Discharge status: Home / Self Care | Payer: Medicaid Other | Source: Other Acute Inpatient Hospital | Attending: Family Medicine | Admitting: Family Medicine

## 2018-02-03 DIAGNOSIS — E46 Unspecified protein-calorie malnutrition: Secondary | ICD-10-CM | POA: Diagnosis present

## 2018-02-03 DIAGNOSIS — J9811 Atelectasis: Secondary | ICD-10-CM | POA: Diagnosis present

## 2018-02-03 DIAGNOSIS — N179 Acute kidney failure, unspecified: Secondary | ICD-10-CM | POA: Diagnosis present

## 2018-02-03 DIAGNOSIS — N182 Chronic kidney disease, stage 2 (mild): Secondary | ICD-10-CM | POA: Diagnosis present

## 2018-02-03 DIAGNOSIS — R7303 Prediabetes: Secondary | ICD-10-CM | POA: Diagnosis present

## 2018-02-03 DIAGNOSIS — K219 Gastro-esophageal reflux disease without esophagitis: Secondary | ICD-10-CM | POA: Diagnosis present

## 2018-02-03 DIAGNOSIS — N261 Atrophy of kidney (terminal): Secondary | ICD-10-CM | POA: Diagnosis present

## 2018-02-03 DIAGNOSIS — N136 Pyonephrosis: Secondary | ICD-10-CM | POA: Diagnosis present

## 2018-02-03 DIAGNOSIS — Z79899 Other long term (current) drug therapy: Secondary | ICD-10-CM | POA: Diagnosis not present

## 2018-02-03 DIAGNOSIS — K861 Other chronic pancreatitis: Secondary | ICD-10-CM | POA: Diagnosis present

## 2018-02-03 DIAGNOSIS — K863 Pseudocyst of pancreas: Secondary | ICD-10-CM | POA: Diagnosis not present

## 2018-02-03 DIAGNOSIS — K851 Biliary acute pancreatitis without necrosis or infection: Principal | ICD-10-CM | POA: Diagnosis present

## 2018-02-03 DIAGNOSIS — Z87440 Personal history of urinary (tract) infections: Secondary | ICD-10-CM

## 2018-02-03 DIAGNOSIS — D631 Anemia in chronic kidney disease: Secondary | ICD-10-CM | POA: Diagnosis present

## 2018-02-03 DIAGNOSIS — K859 Acute pancreatitis without necrosis or infection, unspecified: Secondary | ICD-10-CM | POA: Diagnosis present

## 2018-02-03 DIAGNOSIS — F329 Major depressive disorder, single episode, unspecified: Secondary | ICD-10-CM | POA: Diagnosis present

## 2018-02-03 DIAGNOSIS — K831 Obstruction of bile duct: Secondary | ICD-10-CM

## 2018-02-03 DIAGNOSIS — Z6841 Body Mass Index (BMI) 40.0 and over, adult: Secondary | ICD-10-CM

## 2018-02-03 DIAGNOSIS — K8020 Calculus of gallbladder without cholecystitis without obstruction: Secondary | ICD-10-CM | POA: Diagnosis present

## 2018-02-03 LAB — CBC WITH DIFFERENTIAL/PLATELET
BASOS ABS: 0 10*3/uL (ref 0.0–0.1)
BLASTS: 0 %
Band Neutrophils: 0 %
Basophils Relative: 0 %
Eosinophils Absolute: 0.2 10*3/uL (ref 0.0–0.7)
Eosinophils Relative: 1 %
HEMATOCRIT: 27.7 % — AB (ref 36.0–46.0)
HEMOGLOBIN: 8.4 g/dL — AB (ref 12.0–15.0)
LYMPHS PCT: 8 %
Lymphs Abs: 1.3 10*3/uL (ref 0.7–4.0)
MCH: 27.4 pg (ref 26.0–34.0)
MCHC: 30.3 g/dL (ref 30.0–36.0)
MCV: 90.2 fL (ref 78.0–100.0)
MONOS PCT: 2 %
Metamyelocytes Relative: 0 %
Monocytes Absolute: 0.3 10*3/uL (ref 0.1–1.0)
Myelocytes: 0 %
NEUTROS ABS: 14.8 10*3/uL — AB (ref 1.7–7.7)
NEUTROS PCT: 89 %
Other: 0 %
PROMYELOCYTES RELATIVE: 0 %
Platelets: 211 10*3/uL (ref 150–400)
RBC: 3.07 MIL/uL — AB (ref 3.87–5.11)
RDW: 15 % (ref 11.5–15.5)
WBC: 16.6 10*3/uL — AB (ref 4.0–10.5)
nRBC: 0 /100 WBC

## 2018-02-03 LAB — COMPREHENSIVE METABOLIC PANEL
ALT: 10 U/L (ref 0–44)
ANION GAP: 11 (ref 5–15)
AST: 15 U/L (ref 15–41)
Albumin: 1.7 g/dL — ABNORMAL LOW (ref 3.5–5.0)
Alkaline Phosphatase: 136 U/L — ABNORMAL HIGH (ref 38–126)
BILIRUBIN TOTAL: 0.7 mg/dL (ref 0.3–1.2)
BUN: 14 mg/dL (ref 6–20)
CHLORIDE: 114 mmol/L — AB (ref 98–111)
CO2: 19 mmol/L — ABNORMAL LOW (ref 22–32)
Calcium: 7.7 mg/dL — ABNORMAL LOW (ref 8.9–10.3)
Creatinine, Ser: 1.96 mg/dL — ABNORMAL HIGH (ref 0.44–1.00)
GFR, EST AFRICAN AMERICAN: 40 mL/min — AB (ref >60)
GFR, EST NON AFRICAN AMERICAN: 34 mL/min — AB (ref >60)
Glucose, Bld: 125 mg/dL — ABNORMAL HIGH (ref 70–99)
POTASSIUM: 4.1 mmol/L (ref 3.5–5.1)
Sodium: 144 mmol/L (ref 135–145)
TOTAL PROTEIN: 6 g/dL — AB (ref 6.5–8.1)

## 2018-02-03 LAB — LIPASE, BLOOD: LIPASE: 147 U/L — AB (ref 11–51)

## 2018-02-03 LAB — IRON AND TIBC
IRON: 36 ug/dL (ref 28–170)
SATURATION RATIOS: 16 % (ref 10.4–31.8)
TIBC: 220 ug/dL — AB (ref 250–450)
UIBC: 184 ug/dL

## 2018-02-03 LAB — LACTIC ACID, PLASMA: Lactic Acid, Venous: 0.9 mmol/L (ref 0.5–1.9)

## 2018-02-03 LAB — MAGNESIUM: MAGNESIUM: 1.8 mg/dL (ref 1.7–2.4)

## 2018-02-03 LAB — FERRITIN: Ferritin: 344 ng/mL — ABNORMAL HIGH (ref 11–307)

## 2018-02-03 MED ORDER — DIPHENHYDRAMINE HCL 25 MG PO CAPS: 25.0000 mg | ORAL_CAPSULE | Freq: Three times a day (TID) | ORAL | Status: DC | PRN

## 2018-02-03 MED ORDER — ONDANSETRON 4 MG PO TBDP
4.0000 mg | ORAL_TABLET | Freq: Four times a day (QID) | ORAL | Status: DC | PRN
  Administered 2018-02-04 – 2018-02-05 (×2): 4 mg via ORAL
  Filled 2018-02-03 (×3): qty 1

## 2018-02-03 MED ORDER — SODIUM CHLORIDE 0.9 % IV SOLN
1.0000 g | Freq: Once | INTRAVENOUS | Status: AC
  Administered 2018-02-03: 1 g via INTRAVENOUS
  Filled 2018-02-03: qty 1

## 2018-02-03 MED ORDER — ENOXAPARIN SODIUM 40 MG/0.4ML ~~LOC~~ SOLN
40.0000 mg | SUBCUTANEOUS | Status: DC
  Administered 2018-02-03 – 2018-02-07 (×5): 40 mg via SUBCUTANEOUS
  Filled 2018-02-03 (×6): qty 0.4

## 2018-02-03 MED ORDER — ONDANSETRON HCL 4 MG/2ML IJ SOLN
4.0000 mg | Freq: Four times a day (QID) | INTRAMUSCULAR | Status: DC | PRN
  Administered 2018-02-04 – 2018-02-07 (×10): 4 mg via INTRAVENOUS
  Filled 2018-02-03 (×10): qty 2

## 2018-02-03 MED ORDER — LACTATED RINGERS IV SOLN
INTRAVENOUS | Status: AC
  Administered 2018-02-03 – 2018-02-05 (×7): via INTRAVENOUS

## 2018-02-03 MED ORDER — SODIUM CHLORIDE 0.9 % IV SOLN
1.0000 g | Freq: Three times a day (TID) | INTRAVENOUS | Status: DC
  Administered 2018-02-04 – 2018-02-07 (×10): 1 g via INTRAVENOUS
  Filled 2018-02-03 (×11): qty 1

## 2018-02-03 MED ORDER — PANTOPRAZOLE SODIUM 40 MG PO TBEC
40.0000 mg | DELAYED_RELEASE_TABLET | Freq: Every day | ORAL | Status: DC
  Administered 2018-02-04 – 2018-02-08 (×5): 40 mg via ORAL
  Filled 2018-02-03 (×5): qty 1

## 2018-02-03 MED ORDER — OXYCODONE HCL 5 MG PO TABS
5.0000 mg | ORAL_TABLET | Freq: Four times a day (QID) | ORAL | Status: DC | PRN
  Administered 2018-02-04 – 2018-02-07 (×3): 5 mg via ORAL
  Filled 2018-02-03 (×3): qty 1

## 2018-02-03 MED ORDER — ERTAPENEM SODIUM 1 G IJ SOLR: 1.0000 g | INTRAMUSCULAR | Status: DC

## 2018-02-03 NOTE — Consult Note (Signed)
Reason for Consult:gallstone pancreatitis Referring Physician: Dr Curlene Labrum is an 27 y.o. female.  HPI:27 year old severely obese Caucasian female transferred from [facility] on Saturday with worsening pancreatitis presumed to be biliary in etiology.  She is about 7 days postpartum.  She had been admitted there a few days ago with gallstone pancreatitis and was discharged but had ongoing nausea and abdominal discomfort which was worsening and prompted her to return to the emergency room on the 12th.  She states that she really has not felt right for the past 2 months.  She states that she has had nausea and poor appetite for the past 2 months.  She states that she was told it was due to urinary tract infections.  She has not eating much over the past week.  She has been having some loose stool as well.  Denies any alcohol or new medications in the past couple months other than antibiotics for the urinary tract infections.  She denies any family history of pancreas problems.  She really did not develop abdominal pain mainly in her upper abdomen until recently she states.  She states that she has a long-standing history of kidney issues she states that she has one kidney that is smaller than another.  She states that she also had an elevated creatinine early in her pregnancy but is unaware of her kidney numbers  Labs at outside hospital yesterday and today showed a creatinine of around 1.8 - 1.9.  White blood cell count was around 14-1/2 thousand.  She had a hemoglobin of 8.4 at outside hospital.  Amylase 124, lipase 181.  She had a positive urinalysis at outside hospital.  CT scan at outside hospital today ongoing pancreatitis.  Along the dorsum of the pancreas a low-attenuation fluid collection has formed measuring 5.3 x 6.9 x 7.75 cm suggestive of an early pseudocyst.  There is a focal area of lower attenuation measuring 2.2 x 2.2 cm in the mid body of the pancreas consistent with a phlegmon  or early ischemia.  There is significant peripancreatic stranding with significant retroperitoneal stranding extending into the pelvis.  There is left renal atrophy and scarring in the lower pole of the region of the right kidney.  There is new right sided hydronephrosis and dilation of the right proximal ureter favored to be related to pancreatitis.  The stomach wall is thickened.  There is thickening and stranding of the upper abdominal small bowel loops likely secondary to inflammatory changes of the pancreas.  The colon is also secondarily inflamed primarily within the region of the hepatic transverse segments.  Past Medical History:  Diagnosis Date  . Depression   . Frequent UTI     Past Surgical History:  Procedure Laterality Date  . KIDNEY SURGERY  2004   ?to lift kidney? d/t frequent UTIs  . TOOTH EXTRACTION N/A 03/03/2014   Procedure: EXTRACTION OF WISDOM TEETH;  Surgeon: [name], DDS;  Location: [place];  Service: Oral Surgery;  Laterality: N/A;    No family history on file.  Social History:  reports that she has never smoked. She does not have any smokeless tobacco history on file. She reports that she drinks alcohol. She reports that she does not use drugs.  Allergies: No Known Allergies  Medications: I have reviewed the patient's current medications.  Results for orders placed or performed during the hospital encounter of 02/03/18 (from the past 48 hour(s))  Comprehensive metabolic panel     Status: Abnormal  Collection Time: 02/03/18  7:25 PM  Result Value Ref Range   Sodium 144 135 - 145 mmol/L   Potassium 4.1 3.5 - 5.1 mmol/L   Chloride 114 (H) 98 - 111 mmol/L    Comment: Please note change in reference range.   CO2 19 (L) 22 - 32 mmol/L   Glucose, Bld 125 (H) 70 - 99 mg/dL    Comment: Please note change in reference range.   BUN 14 6 - 20 mg/dL    Comment: Please note change in reference range.   Creatinine, Ser 1.96 (H) 0.44 - 1.00 mg/dL   Calcium 7.7 (L)  8.9 - 10.3 mg/dL   Total Protein 6.0 (L) 6.5 - 8.1 g/dL   Albumin 1.7 (L) 3.5 - 5.0 g/dL   AST 15 15 - 41 U/L   ALT 10 0 - 44 U/L    Comment: Please note change in reference range.   Alkaline Phosphatase 136 (H) 38 - 126 U/L   Total Bilirubin 0.7 0.3 - 1.2 mg/dL   GFR calc non Af Amer 34 (L) >60 mL/min   GFR calc Af Amer 40 (L) >60 mL/min    Comment: (NOTE) The eGFR has been calculated using the CKD EPI equation. This calculation has not been validated in all clinical situations. eGFR's persistently <60 mL/min signify possible Chronic Kidney Disease.    Anion gap 11 5 - 15    Comment: Performed at [facility] [address] [place], [place] [address]  Magnesium     Status: None   Collection Time: 02/03/18  7:25 PM  Result Value Ref Range   Magnesium 1.8 1.7 - 2.4 mg/dL    Comment: Performed at [facility], [place] [address], [place], [place] [address]  CBC WITH DIFFERENTIAL     Status: Abnormal   Collection Time: 02/03/18  7:25 PM  Result Value Ref Range   WBC 16.6 (H) 4.0 - 10.5 K/uL   RBC 3.07 (L) 3.87 - 5.11 MIL/uL   Hemoglobin 8.4 (L) 12.0 - 15.0 g/dL   HCT 27.7 (L) 36.0 - 46.0 %   MCV 90.2 78.0 - 100.0 fL   MCH 27.4 26.0 - 34.0 pg   MCHC 30.3 30.0 - 36.0 g/dL   RDW 15.0 11.5 - 15.5 %   Platelets 211 150 - 400 K/uL   Neutrophils Relative % 89 %   Lymphocytes Relative 8 %   Monocytes Relative 2 %   Eosinophils Relative 1 %   Basophils Relative 0 %   Band Neutrophils 0 %   Metamyelocytes Relative 0 %   Myelocytes 0 %   Promyelocytes Relative 0 %   Blasts 0 %   nRBC 0 0 /100 WBC   Other 0 %   Neutro Abs 14.8 (H) 1.7 - 7.7 K/uL   Lymphs Abs 1.3 0.7 - 4.0 K/uL   Monocytes Absolute 0.3 0.1 - 1.0 K/uL   Eosinophils Absolute 0.2 0.0 - 0.7 K/uL   Basophils Absolute 0.0 0.0 - 0.1 K/uL   Smear Review MORPHOLOGY UNREMARKABLE     Comment: Performed at [facility], [place] [address], [place], [place] [address]  Lactic acid, plasma     Status: None    Collection Time: 02/03/18  7:45 PM  Result Value Ref Range   Lactic Acid, Venous 0.9 0.5 - 1.9 mmol/L    Comment: Performed at [facility] [address], [place], [place] [address]    No results  found.  Review of Systems  Constitutional: Positive for malaise/fatigue and weight loss.  HENT: Negative for nosebleeds.   Eyes: Negative for blurred vision.  Respiratory: Negative for shortness of breath.   Cardiovascular: Negative for chest pain, palpitations, orthopnea and PND.       Denies DOE  Gastrointestinal: Positive for abdominal pain, diarrhea and nausea. Negative for blood in stool and melena.  Genitourinary: Negative for dysuria and hematuria.  Musculoskeletal: Negative.   Skin: Negative for itching and rash.  Neurological: Negative for dizziness, focal weakness, seizures, loss of consciousness and headaches.  Endo/Heme/Allergies: Does not bruise/bleed easily.  Psychiatric/Behavioral: The patient is not nervous/anxious.    Blood pressure (!) 143/69, pulse 82, temperature (!) 97.4 F (36.3 C), temperature source Oral, resp. rate (!) 21, height _0  (1.626 m), weight 121.9 kg (268 lb 11.9 oz), last menstrual period 05/24/2017, SpO2 94 %. Physical Exam  Vitals reviewed. Constitutional: She is oriented to person, place, and time. She appears well-developed and well-nourished. She has a sickly appearance. No distress.  Severe obesity; washcloth on forehead, sweaty.   HENT:  Head: Normocephalic and atraumatic.  Right Ear: External ear normal.  Left Ear: External ear normal.  Eyes: Conjunctivae are normal. No scleral icterus.  Neck: Normal range of motion. Neck supple. No tracheal deviation present. No thyromegaly present.  Cardiovascular: Normal rate and normal heart sounds.  Respiratory: Effort normal and breath sounds normal. No stridor. No respiratory distress. She has no wheezes.  GI: Soft. Normal appearance. There is no rigidity and no guarding.  Central truncal  obesity; generalized TTP in RUQ/epigastric/LUQ; most TTP in epigastrium. No rebound/guarding.   Musculoskeletal: She exhibits no edema or tenderness.  Lymphadenopathy:    She has no cervical adenopathy.  Neurological: She is alert and oriented to person, place, and time. She exhibits normal muscle tone.  Skin: Skin is warm and dry. No rash noted. She is not diaphoretic. No erythema. No pallor.  Psychiatric: She has a normal mood and affect. Her behavior is normal. Judgment and thought content normal.    Assessment/Plan: Acute pancreatitis with peripancreatic fluid collection measuring 5.3 x 6.9 x 7.5 cm along with significant retroperitoneal stranding and reactive small bowel wall thickening New phlegmon in the mid pancreatic body measuring 2.2 cm Anemia of unclear etiology Elevated creatinine- unknown patient's baseline creatinine Significant left kidney atrophy Right-sided hydronephrosis likely secondary to pancreatitis Urinary tract infection Significant protein calorie malnutrition Gallstones  Radiographically her pancreatitis is pretty impressive.  She has extensive secondary inflammation involving the surrounding small bowel and hepatic flexure.  Therefore I would not recommend cholecystectomy at this time.  We can see how her pancreatitis progresses over the next few days but certainly she is not a candidate for cholecystectomy in the next 48 to 72 hours  On the imaging so far there is no evidence of cholecystitis. I do not think she needs antibiotic coverage for her pancreatitis but will defer to GI Would NOT recommend percutaneous drainage of the peripancreatic fluid collection at this time point If the liquid diet exacerbates her discomfort and/or worsens her pancreatitis then alternative sources of nutrition will need to be entertained sooner rather than later since her nutritional status is already poor Check prealbumin in a.m.  Follow creatinine F/u urine cx at OSH We will  see again on Monday  Jermiah Soderman M. Redmond Pulling, MD, FACS General, Bariatric, & Minimally Invasive Surgery [facility]  Greer Pickerel 02/03/2018, 9:11 PM

## 2018-02-03 NOTE — Progress Notes (Signed)
MD notified of patient's arrival to our unit and needing orders.

## 2018-02-03 NOTE — Progress Notes (Signed)
Sharon Duncan is a [age] y.o. female patient admitted from ED awake, alert - oriented  X 4 - no acute distress noted.  VSS - Blood pressure (!) 143/69, pulse 82, temperature 97.7 F (36.5 C), temperature source Oral, resp. rate (!) 21, height 5\' 4"  (1.626 m), weight 121.9 kg (268 lb 11.9 oz), last menstrual period 05/24/2017, SpO2 94 %.    IV in place, occlusive dsg intact without redness.  Orientation to room, and floor completed with information packet given to patient/family.  Patient declined safety video at this time.  Admission INP armband ID verified with patient/family, and in place.   SR up x 2, fall assessment complete, with patient and family able to verbalize understanding of risk associated with falls, and verbalized understanding to call nsg before up out of bed.  Call light within reach, patient able to voice, and demonstrate understanding.  Skin, clean-dry- intact without evidence of bruising, or skin tears.   No evidence of skin break down noted on exam.     Will cont to eval and treat per MD orders.  Marca AnconaLaura M Maitri Schnoebelen, RN 02/03/2018 6:49 PM

## 2018-02-03 NOTE — H&P (Addendum)
History and Physical  Sharon Duncan ZOX:[id number] DOB: 01-29-1991 DOA: 02/03/2018 1816  Transferring hospital: [facility] Referring physician: n/a PCP:  n/a Outpatient Specialists: n/a  Chief Complaint: recurrent acute complicated pancreatitis (post-partum) with pseudocyst and gallstones  HPI: Sharon Duncan is a 27 y.o. female who is 7 days post partum presents as OSH transfer from [facility] who had acute pancreatitis immediately post-partum and returned to [facility] with worsening nausea and vomiting. Per outside report, suspected that pancreatitis was related to gallstones (no cholecystitis suspected on initial pancreatitis episode) which was treated conservatively with antibiotics and surgery was planned for elective cholecystectomy as an outpatient. She gave birth to her baby girl on 01/26/18 via uncomplicated vaginal delivery. After discharge home, patient reports that she was eating semi-solid food for 2 days then developed progressive nausea, PO intolerance and epigastric pain x 1 day. Only minimally alleviated with percoset. She was evaluated at [facility] ED and CT abd/pelvis there was concerning for large pancreatitic pseudocyst or phlegmon  measuring 5 x 6 x 7 cm with significant peri-pancreatitic stranding. Received zofran, 1 dose of IV zosyn, and PO oxycodone. Transferred to [facility] given lack of GI speciality services at [facility]. Patient denies having complications with her prior pregnancies or ever having pancreatitis in the past until now. This pregnancy was also complicated by reported "vasculitits" several months ago, which had resembled hand-foot-mouth rash per the patient and has since resolved. Denies recent alcohol use. No known history of cholecystitis in the past. She reports she is currently not breastfeeding and does not plan to breastfeed during this pregnancy at this time.   Review of Systems: + epigastric abdominal pain, severe nausea and NBNB vomiting -  denies no tarry, melanotic or bloody stools - no dysuria, increased urinary frequency - no fevers/chills - no cough - no chest pain, dyspnea on exertion - no edema, PND, orthopnea - no weight changes  Rest of systems reviewed are negative, except as per above history.   Past Medical History:  Diagnosis Date  . Depression   . Frequent UTI    Past Surgical History:  Procedure Laterality Date  . KIDNEY SURGERY  2004   ?to lift kidney? d/t frequent UTIs  . TOOTH EXTRACTION N/A 03/03/2014   Procedure: EXTRACTION OF WISDOM TEETH;  Surgeon: Georgia Lopes, DDS;  Location: [facility] OR;  Service: Oral Surgery;  Laterality: N/A;   Social History:  reports that she has never smoked. She does not have any smokeless tobacco history on file. She reports that she drinks alcohol. She reports that she does not use drugs.  No Known Allergies  No family history on file. (non contributory)  Prior to Admission medications   Medication Sig Start Date End Date Taking? Authorizing Provider  acetaminophen (TYLENOL) 500 MG tablet Take 500 mg by mouth every 6 (six) hours as needed for moderate pain.    [provider]  cephALEXin (KEFLEX) 500 MG capsule Take 1 capsule (500 mg total) by mouth 4 (four) times daily. 07/28/17   Judeth Horn, NP  DiphenhydrAMINE HCl (ALLERGY MED PO) Take 1 tablet by mouth daily as needed (for allergies).    [provider]  ondansetron (ZOFRAN ODT) 4 MG disintegrating tablet Take 1 tablet (4 mg total) by mouth every 8 (eight) hours as needed. Patient not taking: Reported on 07/28/2017 10/12/14   Vanetta Mulders, MD    Temp:  [97.7 F (36.5 C)] 97.7 F (36.5 C) (07/13 1846) Pulse Rate:  [82] 82 (  07/13 1846) Resp:  [21] 21 (07/13 1846) BP: (143)/(69) 143/69 (07/13 1846) SpO2:  [94 %] 94 % (07/13 1846) Weight:  [121.9 kg (268 lb 11.9 oz)] 121.9 kg (268 lb 11.9 oz) (07/13 1822)  PHYSICAL EXAM:  BP (!) 143/69 (BP Location: Left Arm)   Pulse 82   Temp 97.7 F  (36.5 C) (Oral)   Resp (!) 21   Ht 5\' 4"  (1.626 m)   Wt 121.9 kg (268 lb 11.9 oz)   LMP 05/24/2017 (Within Weeks)   SpO2 94%   BMI 46.13 kg/m   GEN obese young caucasian female; resting comfortably in bed  HEENT NCAT EOM PERRL; clear oropharynx, no cervical LAD; dry mucus membranes  JVP estimated 5 cm H2O above RA; no HJR ; no carotid bruits b/l ;  CV regular normal rate; normal S1 and S2; no m/r/g or S3/S4; PMI non displaced; no parasternal heave  RESP diminished at bases and clear; breathing unlabored and symmetric  ABD soft, tender to mid epigastrium with deep palpation; not distended, no rebound or guarding; hypoactive BS; negative Murphy's EXT warm throughout b/l; no peripheral edema b/l  PULSES  DP and radials intact 2+ bilaterally  SKIN/MSK no rashes or lesions  NEURO/PSYCH no focal deficits   LABS ON ADMISSION: (pending) Basic Metabolic Panel: No results for input(s): NA, K, CL, CO2, GLUCOSE, BUN, CREATININE, CALCIUM, MG, PHOS in the last 168 hours. CBC: No results for input(s): WBC, NEUTROABS, HGB, HCT, MCV, PLT in the last 168 hours. Liver Function Tests: No results for input(s): AST, ALT, ALKPHOS, BILITOT, PROT, ALBUMIN in the last 168 hours. No results for input(s): LIPASE, AMYLASE in the last 168 hours. No results for input(s): AMMONIA in the last 168 hours. Coagulation:  No results found for: INR, PROTIME No results found for: PTT Lactic Acid, Venous: pending Cardiac Enzymes: No results for input(s): CKTOTAL, CKMB, CKMBINDEX, TROPONINI in the last 168 hours.  BNP (last 3 results) No results for input(s): PROBNP in the last 8760 hours. CBG: No results for input(s): GLUCAP in the last 168 hours.  Radiological Exams on Admission: (reviewed OSH CT report) No results found.  EKG: NSR with normal ST baseline  Assessment/Plan  Sharon Duncan is a 27 y.o. female post-partum (vaginal deliver on 01/26/18) complicated by pancreatitis immediately post-op and is  admitted as OSH transfer from [facility] with recurrent acute (?suspected gallstone) pancreatitis and concern for pseudocyst. Upon transfer appears hemodynamically stable with benign abdomen. Plan for medical tx with fluids and IV antibiotics and advance diet as tolerated. Will need to determine timing of definitive therapy if gallstones are indeed causing recurrent pancreatitis -- i.e. cholecystectomy. If patient does not clinically improve with medical therapy, will need to consider ERCP if suspect ductal obstruction.    Active Problems:   Acute recurrent pancreatitis   # Acute recurrent pancreatitis with possible pseudocyst and+gallstones. Hemodynamically stable. Benign abdominal exam on admission.  > OSH CT abd/pelvis: 5x6x7 cm collection along dorsal pancreas with peripancreatic stranding +gallstones - meropenem IV 1gm q8h (previously received zosyn at OSH) - LR at 150 cc/hr - thin liquids diet today (patient states unable to tolerate solid foods at this time) - GI Deboraha Sprang) consulted - general surgery consulted re: timing of cholecystectomy - RUQ abdomen tomorrow to assess gallbladder and CBD - labs pending: lactate, lipase, CMP, CBC  # Atelectasis and small pleural effusions on OSH admission CT scan - incentive spirometry - pulse ox with vitals  # AKI with admission Cr 1.9  likely pre-renal. Baseline Cr likely 1.0-1.1  - fluids as above  - continue to monitor  # R sided hydronephrosis without obstruction - suspected related to pancreatitis per OSH radiology report. No urinary sx at this time. Persistent L sided atrophy of L kidney (unchanged)  - continue to monitor: if develops urinary sx, repeat CT scan and consult urology  # Hx of depression - currently not on any medication  - monitor, currently no acute depressive sx but low threshold to start SSRI if concern for postpartum depression   DVT Prophylaxis: lovenox  Code Status:  Full Code Family Communication: patient will  update family  Disposition Plan: Admit to medicine. discharge pending GI evaluation and clinical improvement. Anticipate >= 2 [facility]  Extended Emergency Contact Information Primary Emergency Contact: Chrys RacerSawyers,Kena          [place], [place] [id number] [place] Home Phone: [phone] Relation: Mother Secondary Emergency Contact: Up[place],Caled Address: [address]          [place], [place] [address] [place] Home Phone: [phone] Relation: Spouse  Time spent: >55 minutes  Ike Benearolyn J Park, MD [facility] Pager [phone]  If 7PM-7AM, please contact night-coverage [email or web address] Password [email or web address] 02/03/2018, 7:13 PM

## 2018-02-04 ENCOUNTER — Inpatient Hospital Stay (HOSPITAL_COMMUNITY): Payer: Medicaid Other

## 2018-02-04 ENCOUNTER — Encounter (HOSPITAL_COMMUNITY): Payer: Self-pay | Admitting: Gastroenterology

## 2018-02-04 ENCOUNTER — Other Ambulatory Visit: Payer: Self-pay

## 2018-02-04 DIAGNOSIS — N179 Acute kidney failure, unspecified: Secondary | ICD-10-CM

## 2018-02-04 DIAGNOSIS — K863 Pseudocyst of pancreas: Secondary | ICD-10-CM

## 2018-02-04 LAB — COMPREHENSIVE METABOLIC PANEL
ALT: 10 U/L (ref 0–44)
ANION GAP: 10 (ref 5–15)
AST: 10 U/L — ABNORMAL LOW (ref 15–41)
Albumin: 1.6 g/dL — ABNORMAL LOW (ref 3.5–5.0)
Alkaline Phosphatase: 128 U/L — ABNORMAL HIGH (ref 38–126)
BUN: 13 mg/dL (ref 6–20)
CHLORIDE: 113 mmol/L — AB (ref 98–111)
CO2: 20 mmol/L — AB (ref 22–32)
Calcium: 7.8 mg/dL — ABNORMAL LOW (ref 8.9–10.3)
Creatinine, Ser: 1.93 mg/dL — ABNORMAL HIGH (ref 0.44–1.00)
GFR, EST AFRICAN AMERICAN: 41 mL/min — AB (ref >60)
GFR, EST NON AFRICAN AMERICAN: 35 mL/min — AB (ref >60)
Glucose, Bld: 135 mg/dL — ABNORMAL HIGH (ref 70–99)
POTASSIUM: 4 mmol/L (ref 3.5–5.1)
SODIUM: 143 mmol/L (ref 135–145)
Total Bilirubin: 0.6 mg/dL (ref 0.3–1.2)
Total Protein: 5.7 g/dL — ABNORMAL LOW (ref 6.5–8.1)

## 2018-02-04 LAB — LIPID PANEL
Cholesterol: 81 mg/dL (ref 0–200)
HDL: 26 mg/dL — AB (ref >40)
LDL Cholesterol: 13 mg/dL (ref 0–99)
Total CHOL/HDL Ratio: 3.1 RATIO
Triglycerides: 210 mg/dL — ABNORMAL HIGH (ref <150)
VLDL: 42 mg/dL — AB (ref 0–40)

## 2018-02-04 LAB — CBC
HEMATOCRIT: 24.2 % — AB (ref 36.0–46.0)
HEMOGLOBIN: 7.5 g/dL — AB (ref 12.0–15.0)
MCH: 27.5 pg (ref 26.0–34.0)
MCHC: 31 g/dL (ref 30.0–36.0)
MCV: 88.6 fL (ref 78.0–100.0)
Platelets: 150 10*3/uL (ref 150–400)
RBC: 2.73 MIL/uL — ABNORMAL LOW (ref 3.87–5.11)
RDW: 14.7 % (ref 11.5–15.5)
WBC: 14.7 10*3/uL — AB (ref 4.0–10.5)

## 2018-02-04 LAB — URINALYSIS, ROUTINE W REFLEX MICROSCOPIC
BILIRUBIN URINE: NEGATIVE
GLUCOSE, UA: NEGATIVE mg/dL
Ketones, ur: 20 mg/dL — AB
Nitrite: NEGATIVE
Protein, ur: 30 mg/dL — AB
SPECIFIC GRAVITY, URINE: 1.01 (ref 1.005–1.030)
WBC, UA: >50 WBC/hpf — ABNORMAL HIGH (ref 0–5)
pH: 6 (ref 5.0–8.0)

## 2018-02-04 LAB — PREALBUMIN: PREALBUMIN: 13 mg/dL — AB (ref 18–38)

## 2018-02-04 LAB — HIV ANTIBODY (ROUTINE TESTING W REFLEX): HIV SCREEN 4TH GENERATION: NONREACTIVE

## 2018-02-04 MED ORDER — PANCRELIPASE (LIP-PROT-AMYL) 12000-38000 UNITS PO CPEP
36000.0000 [IU] | ORAL_CAPSULE | Freq: Three times a day (TID) | ORAL | Status: DC
  Administered 2018-02-04 – 2018-02-08 (×12): 36000 [IU] via ORAL
  Filled 2018-02-04 (×12): qty 3

## 2018-02-04 MED ORDER — ALUM & MAG HYDROXIDE-SIMETH 200-200-20 MG/5ML PO SUSP
30.0000 mL | Freq: Four times a day (QID) | ORAL | Status: DC | PRN
Start: 2018-02-04 — End: 2018-02-08
  Administered 2018-02-04: 30 mL via ORAL
  Filled 2018-02-04: qty 30

## 2018-02-04 NOTE — Consult Note (Signed)
Reason for Consult:pancreatitis Referring Physician: Hospital team  Sharon Duncan is an 27 y.o. female.  HPI: patient seen and examined and hospital computer chart reviewed and case discussed with [facility] hospitalists yesterday and our hospitalist today and she does look better than her story sounds and her CT looks and she was sick for the last 2 months of her pregnancy but was not diagnosed with gallstones until she delivered and gallstones and no other GI problems run in the family and she has had 4 children and she's been unable to keep anything but liquids down and her pain comes and goes but she is moving her bowels and pancreatitis does not run in the family either and she does not drink alcohol and has no other complaints  Past Medical History:  Diagnosis Date  . Depression   . Frequent UTI     Past Surgical History:  Procedure Laterality Date  . KIDNEY SURGERY  2004   ?to lift kidney? d/t frequent UTIs  . TOOTH EXTRACTION N/A 03/03/2014   Procedure: EXTRACTION OF WISDOM TEETH;  Surgeon: [name], DDS;  Location: [facility];  Service: Oral Surgery;  Laterality: N/A;    History reviewed. No pertinent family history.  Social History:  reports that she has never smoked. She does not have any smokeless tobacco history on file. She reports that she drinks alcohol. She reports that she does not use drugs.  Allergies: No Known Allergies  Medications: I have reviewed the patient's current medications.  Results for orders placed or performed during the hospital encounter of 02/03/18 (from the past 48 hour(s))  HIV antibody (Routine Testing)     Status: None   Collection Time: 02/03/18  7:25 PM  Result Value Ref Range   HIV Screen 4th Generation wRfx Non Reactive Non Reactive    Comment: (NOTE) Performed At: [facility] [place], [place] [id number] Rush Farmer MD LY:[id number]   Comprehensive metabolic panel     Status: Abnormal   Collection Time: 02/03/18   7:25 PM  Result Value Ref Range   Sodium 144 135 - 145 mmol/L   Potassium 4.1 3.5 - 5.1 mmol/L   Chloride 114 (H) 98 - 111 mmol/L    Comment: Please note change in reference range.   CO2 19 (L) 22 - 32 mmol/L   Glucose, Bld 125 (H) 70 - 99 mg/dL    Comment: Please note change in reference range.   BUN 14 6 - 20 mg/dL    Comment: Please note change in reference range.   Creatinine, Ser 1.96 (H) 0.44 - 1.00 mg/dL   Calcium 7.7 (L) 8.9 - 10.3 mg/dL   Total Protein 6.0 (L) 6.5 - 8.1 g/dL   Albumin 1.7 (L) 3.5 - 5.0 g/dL   AST 15 15 - 41 U/L   ALT 10 0 - 44 U/L    Comment: Please note change in reference range.   Alkaline Phosphatase 136 (H) 38 - 126 U/L   Total Bilirubin 0.7 0.3 - 1.2 mg/dL   GFR calc non Af Amer 34 (L) >60 mL/min   GFR calc Af Amer 40 (L) >60 mL/min    Comment: (NOTE) The eGFR has been calculated using the CKD EPI equation. This calculation has not been validated in all clinical situations. eGFR's persistently <60 mL/min signify possible Chronic Kidney Disease.    Anion gap 11 5 - 15    Comment: Performed at [facility] [address], [place], [place]  [address]  Magnesium     Status: None   Collection Time: 02/03/18  7:25 PM  Result Value Ref Range   Magnesium 1.8 1.7 - 2.4 mg/dL    Comment: Performed at [facility], [place] [address], [place], [place] [address]  CBC WITH DIFFERENTIAL     Status: Abnormal   Collection Time: 02/03/18  7:25 PM  Result Value Ref Range   WBC 16.6 (H) 4.0 - 10.5 K/uL   RBC 3.07 (L) 3.87 - 5.11 MIL/uL   Hemoglobin 8.4 (L) 12.0 - 15.0 g/dL   HCT 27.7 (L) 36.0 - 46.0 %   MCV 90.2 78.0 - 100.0 fL   MCH 27.4 26.0 - 34.0 pg   MCHC 30.3 30.0 - 36.0 g/dL   RDW 15.0 11.5 - 15.5 %   Platelets 211 150 - 400 K/uL   Neutrophils Relative % 89 %   Lymphocytes Relative 8 %   Monocytes Relative 2 %   Eosinophils Relative 1 %   Basophils Relative 0 %   Band Neutrophils 0 %   Metamyelocytes Relative 0 %   Myelocytes 0 %    Promyelocytes Relative 0 %   Blasts 0 %   nRBC 0 0 /100 WBC   Other 0 %   Neutro Abs 14.8 (H) 1.7 - 7.7 K/uL   Lymphs Abs 1.3 0.7 - 4.0 K/uL   Monocytes Absolute 0.3 0.1 - 1.0 K/uL   Eosinophils Absolute 0.2 0.0 - 0.7 K/uL   Basophils Absolute 0.0 0.0 - 0.1 K/uL   Smear Review MORPHOLOGY UNREMARKABLE     Comment: Performed at [facility], [place] [address] [place], [place] [address]  Lactic acid, plasma     Status: None   Collection Time: 02/03/18  7:45 PM  Result Value Ref Range   Lactic Acid, Venous 0.9 0.5 - 1.9 mmol/L    Comment: Performed at [facility] [address], [place], [place] [address]  Lipase, blood     Status: Abnormal   Collection Time: 02/03/18  9:33 PM  Result Value Ref Range   Lipase 147 (H) 11 - 51 U/L    Comment: Performed at [facility], [place] [address], [place], [place] [address]  Ferritin     Status: Abnormal   Collection Time: 02/03/18  9:33 PM  Result Value Ref Range   Ferritin 344 (H) 11 - 307 ng/mL    Comment: Performed at [facility], [place] [address], [place], [place] [address]  Iron and TIBC     Status: Abnormal   Collection Time: 02/03/18  9:33 PM  Result Value Ref Range   Iron 36 28 - 170 ug/dL   TIBC 220 (L) 250 - 450 ug/dL   Saturation Ratios 16 10.4 - 31.8 %   UIBC 184 ug/dL    Comment: Performed at [facility], [place] [address] [place], [place] [address]  Comprehensive metabolic panel     Status: Abnormal   Collection Time: 02/04/18  5:20 AM  Result Value Ref Range   Sodium 143 135 - 145 mmol/L   Potassium 4.0 3.5 - 5.1 mmol/L   Chloride 113 (H) 98 - 111 mmol/L    Comment: Please note change in reference range.   CO2 20 (L) 22 - 32 mmol/L   Glucose, Bld 135 (H) 70 - 99 mg/dL    Comment: Please note change in reference range.   BUN 13 6 - 20 mg/dL  Comment: Please note change in reference range.   Creatinine, Ser 1.93 (H) 0.44 - 1.00 mg/dL   Calcium 7.8 (L) 8.9 - 10.3 mg/dL   Total Protein 5.7 (L)  6.5 - 8.1 g/dL   Albumin 1.6 (L) 3.5 - 5.0 g/dL   AST 10 (L) 15 - 41 U/L   ALT 10 0 - 44 U/L    Comment: Please note change in reference range.   Alkaline Phosphatase 128 (H) 38 - 126 U/L   Total Bilirubin 0.6 0.3 - 1.2 mg/dL   GFR calc non Af Amer 35 (L) >60 mL/min   GFR calc Af Amer 41 (L) >60 mL/min    Comment: (NOTE) The eGFR has been calculated using the CKD EPI equation. This calculation has not been validated in all clinical situations. eGFR's persistently <60 mL/min signify possible Chronic Kidney Disease.    Anion gap 10 5 - 15    Comment: Performed at [facility] [address] [place], [place] [address]  CBC     Status: Abnormal   Collection Time: 02/04/18  5:20 AM  Result Value Ref Range   WBC 14.7 (H) 4.0 - 10.5 K/uL   RBC 2.73 (L) 3.87 - 5.11 MIL/uL   Hemoglobin 7.5 (L) 12.0 - 15.0 g/dL   HCT 24.2 (L) 36.0 - 46.0 %   MCV 88.6 78.0 - 100.0 fL   MCH 27.5 26.0 - 34.0 pg   MCHC 31.0 30.0 - 36.0 g/dL   RDW 14.7 11.5 - 15.5 %   Platelets 150 150 - 400 K/uL    Comment: Performed at [facility], [place] [address] [place], [place] [address]  Lipid panel     Status: Abnormal   Collection Time: 02/04/18  5:20 AM  Result Value Ref Range   Cholesterol 81 0 - 200 mg/dL   Triglycerides 210 (H) <150 mg/dL   HDL 26 (L) >40 mg/dL   Total CHOL/HDL Ratio 3.1 RATIO   VLDL 42 (H) 0 - 40 mg/dL   LDL Cholesterol 13 0 - 99 mg/dL    Comment:        Total Cholesterol/HDL:CHD Risk Coronary Heart Disease Risk Table                     Men   Women  1/2 Average Risk   3.4   3.3  Average Risk       5.0   4.4  2 X Average Risk   9.6   7.1  3 X Average Risk  23.4   11.0        Use the calculated Patient Ratio above and the CHD Risk Table to determine the patient's CHD Risk.        ATP III CLASSIFICATION (LDL):  <100     mg/dL   Optimal  100-129  mg/dL   Near or Above                    Optimal  130-159  mg/dL   Borderline  160-189  mg/dL   High  >190     mg/dL    Very High Performed at [facility] [address] [place], [place] [address]   Prealbumin     Status: Abnormal   Collection Time: 02/04/18  5:20 AM  Result Value Ref Range   Prealbumin 13.0 (L) 18 - 38 mg/dL    Comment: Performed at [facility]  [address] [place], [place] [address]    US Abdomen Limited Ruq  Result Date: 02/04/2018 CLINICAL DATA:  Common bile duct obstruction EXAM: ULTRASOUND ABDOMEN LIMITED RIGHT UPPER QUADRANT COMPARISON:  None. FINDINGS: Gallbladder: Gallbladder sludge with small gallstones. Negative sonographic Murphy sign. No gallbladder wall thickening. Common bile duct: Diameter: Poorly visualized common bile duct without dilatation. 3.4 mm. Liver: Diffusely increased echogenicity liver. No focal liver lesion. Portal vein is patent on color Doppler imaging with normal direction of blood flow towards the liver. Minimal free fluid Exam limited by obesity IMPRESSION: Gallbladder sludge and gallstones without evidence of acute cholecystitis. No biliary dilatation Hepatic steatosis Electronically Signed   By: Franchot Gallo M.D.   On: 02/04/2018 07:53    ROSnegative except above Blood pressure (!) 141/88, pulse 70, temperature 98.4 F (36.9 C), temperature source Oral, resp. rate (!) 23, height '5\' 4"'  (1.626 m), weight 121.9 kg (268 lb 11.9 oz), last menstrual period 05/24/2017, SpO2 97 %. Physical Examvital signs stable afebrile no acute distress lying comfortably in the bed lungs are clear heart regular rate and rhythm abdomen is soft rare bowel sound minimal upper discomfort nontender lower labs and CT reviewed  Assessment/Plan: Significant pancreatitis Plan: I encouraged her to ambulate and will begin clear liquids and pancreatic enzymes and if not tolerated might need either duodenal tube feeds or TPN and will probably need an MRCP and MRI of the pancreas at some point and might be able to stop antibiotics soon and will follow with you  Mekiah [place]  E 02/04/2018, 2:12 PM

## 2018-02-04 NOTE — Progress Notes (Addendum)
PROGRESS NOTE  Sharon Duncan HQI:[id number] DOB: Oct 01, 1990 DOA: 02/03/2018 PCP: System, Pcp Not In  HPI/Recap of past 24 hours:  Reports vomited last night, none this am, + flatus, remain npo Currently pain is tolerable, no fever Significant other at bedside  Assessment/Plan: Active Problems:   Acute recurrent pancreatitis   Acute recurrent pancreatitis with possible pseudocyst and+gallstones Triglyceride 210 on presentation Npo, on ivf, imipenem Leukocytosis down from 16.6 on admission to 14.7 [facility] and [facility] consulted, diet per gi  AKI on CKDII Cr baseline around 1.1-1.3  Cr on presentation is 1.96  R sided hydronephrosis without obstruction - suspected related to pancreatitis per OSH radiology report. No urinary sx at this time. Persistent L sided atrophy of L kidney (unchanged)             - continue to monitor: if develops urinary sx, repeat CT scan and consult urology   Elevated am blood glucose, will check a1c  Anemia hgb 8.4-7.5  Body mass index is 46.13 kg/m. meet morbid obesity criteria.    Hx of depression - currently not on any medication             - monitor, currently no acute depressive sx but low threshold to start SSRI if concern for postpartum depression     Code Status: full  Family Communication: patient and significant other  Disposition Plan: not ready to discharge   Consultants:  General surgery  [facility]  Procedures:  none  Antibiotics:  Meropenem    Objective: BP (!) 141/88   Pulse 70   Temp 98.4 F (36.9 C) (Oral)   Resp (!) 23   Ht 5\' 4"  (1.626 m)   Wt 121.9 kg (268 lb 11.9 oz)   LMP 05/24/2017 (Within Weeks)   SpO2 97%   BMI 46.13 kg/m   Intake/Output Summary (Last 24 hours) at 02/04/2018 1316 Last data filed at 02/04/2018 0900 Gross per 24 hour  Intake 1108.4 ml  Output -  Net 1108.4 ml   Filed Weights   02/03/18 1822  Weight: 121.9 kg (268 lb 11.9 oz)    Exam: Patient is examined  daily including today on 02/04/2018, exams remain the same as of yesterday except that has changed    General:  NAD, flat affect  Cardiovascular: RRR  Respiratory: CTABL  Abdomen: some epigastric tenderness, no guarding, no rebound, decreased bowel sounds  Musculoskeletal: No Edema  Neuro: alert, oriented   Data Reviewed: Basic Metabolic Panel: Recent Labs  Lab 02/03/18 1925 02/04/18 0520  NA 144 143  K 4.1 4.0  CL 114* 113*  CO2 19* 20*  GLUCOSE 125* 135*  BUN 14 13  CREATININE 1.96* 1.93*  CALCIUM 7.7* 7.8*  MG 1.8  --    Liver Function Tests: Recent Labs  Lab 02/03/18 1925 02/04/18 0520  AST 15 10*  ALT 10 10  ALKPHOS 136* 128*  BILITOT 0.7 0.6  PROT 6.0* 5.7*  ALBUMIN 1.7* 1.6*   Recent Labs  Lab 02/03/18 2133  LIPASE 147*   No results for input(s): AMMONIA in the last 168 hours. CBC: Recent Labs  Lab 02/03/18 1925 02/04/18 0520  WBC 16.6* 14.7*  NEUTROABS 14.8*  --   HGB 8.4* 7.5*  HCT 27.7* 24.2*  MCV 90.2 88.6  PLT 211 150   Cardiac Enzymes:   No results for input(s): CKTOTAL, CKMB, CKMBINDEX, TROPONINI in the last 168 hours. BNP (last 3 results) No results for input(s): BNP in the last 8760  hours.  ProBNP (last 3 results) No results for input(s): PROBNP in the last 8760 hours.  CBG: No results for input(s): GLUCAP in the last 168 hours.  No results found for this or any previous visit (from the past 240 hour(s)).   Studies: Koreas Abdomen Limited Ruq  Result Date: 02/04/2018 CLINICAL DATA:  Common bile duct obstruction EXAM: ULTRASOUND ABDOMEN LIMITED RIGHT UPPER QUADRANT COMPARISON:  None. FINDINGS: Gallbladder: Gallbladder sludge with small gallstones. Negative sonographic Murphy sign. No gallbladder wall thickening. Common bile duct: Diameter: Poorly visualized common bile duct without dilatation. 3.4 mm. Liver: Diffusely increased echogenicity liver. No focal liver lesion. Portal vein is patent on color Doppler imaging with normal  direction of blood flow towards the liver. Minimal free fluid Exam limited by obesity IMPRESSION: Gallbladder sludge and gallstones without evidence of acute cholecystitis. No biliary dilatation Hepatic steatosis Electronically Signed   By: Marlan Palauharles  Clark M.D.   On: 02/04/2018 07:53    Scheduled Meds: . enoxaparin (LOVENOX) injection  40 mg Subcutaneous Q24H  . pantoprazole  40 mg Oral Daily    Continuous Infusions: . lactated ringers 150 mL/hr at 02/04/18 1258  . meropenem (MERREM) IV 1 g (02/04/18 0517)     Time spent: 35 mins I have personally reviewed and interpreted on  02/04/2018 daily labs,  imagings as discussed above under date review session and assessment and plans.  I reviewed all nursing notes, pharmacy notes, consultant notes,  vitals, pertinent old records  I have discussed plan of care as described above with RN , patient and family on 02/04/2018   Albertine GratesFang Bobbye Petti MD, PhD  [facility] Pager [phone]. If 7PM-7AM, please contact night-coverage at [email or web address], password [email or web address] 02/04/2018, 1:16 PM  LOS: 1 day

## 2018-02-05 LAB — CBC
HEMATOCRIT: 23.3 % — AB (ref 36.0–46.0)
HEMOGLOBIN: 7.2 g/dL — AB (ref 12.0–15.0)
MCH: 27.4 pg (ref 26.0–34.0)
MCHC: 30.9 g/dL (ref 30.0–36.0)
MCV: 88.6 fL (ref 78.0–100.0)
Platelets: 148 10*3/uL — ABNORMAL LOW (ref 150–400)
RBC: 2.63 MIL/uL — ABNORMAL LOW (ref 3.87–5.11)
RDW: 14.6 % (ref 11.5–15.5)
WBC: 12.6 10*3/uL — AB (ref 4.0–10.5)

## 2018-02-05 LAB — HEMOGLOBIN A1C
Hgb A1c MFr Bld: 6.1 % — ABNORMAL HIGH (ref 4.8–5.6)
MEAN PLASMA GLUCOSE: 128.37 mg/dL

## 2018-02-05 LAB — PROTIME-INR
INR: 1.2
Prothrombin Time: 15.1 seconds (ref 11.4–15.2)

## 2018-02-05 LAB — COMPREHENSIVE METABOLIC PANEL
ALBUMIN: 1.6 g/dL — AB (ref 3.5–5.0)
ALT: 8 U/L (ref 0–44)
ANION GAP: 11 (ref 5–15)
AST: 10 U/L — ABNORMAL LOW (ref 15–41)
Alkaline Phosphatase: 105 U/L (ref 38–126)
BUN: 8 mg/dL (ref 6–20)
CO2: 21 mmol/L — ABNORMAL LOW (ref 22–32)
Calcium: 7.8 mg/dL — ABNORMAL LOW (ref 8.9–10.3)
Chloride: 112 mmol/L — ABNORMAL HIGH (ref 98–111)
Creatinine, Ser: 1.76 mg/dL — ABNORMAL HIGH (ref 0.44–1.00)
GFR calc Af Amer: 45 mL/min — ABNORMAL LOW (ref >60)
GFR, EST NON AFRICAN AMERICAN: 39 mL/min — AB (ref >60)
Glucose, Bld: 119 mg/dL — ABNORMAL HIGH (ref 70–99)
POTASSIUM: 4 mmol/L (ref 3.5–5.1)
Sodium: 144 mmol/L (ref 135–145)
Total Bilirubin: 0.6 mg/dL (ref 0.3–1.2)
Total Protein: 7.4 g/dL (ref 6.5–8.1)

## 2018-02-05 LAB — LIPASE, BLOOD: Lipase: 108 U/L — ABNORMAL HIGH (ref 11–51)

## 2018-02-05 MED ORDER — PROMETHAZINE HCL 25 MG/ML IJ SOLN: 12.5000 mg | Freq: Four times a day (QID) | INTRAMUSCULAR | Status: DC | PRN

## 2018-02-05 NOTE — Progress Notes (Signed)
PROGRESS NOTE  Sharon Duncan ZOX:[id number] DOB: 1991/04/25 DOA: 02/03/2018 PCP: System, Pcp Not In  HPI/Recap of past 24 hours:  She is feeling better, On clears,  + flatus, reports loose stool, some nausea, denies ab pain  no fever Significant other at bedside  Assessment/Plan: Active Problems:   Acute recurrent pancreatitis   Acute recurrent pancreatitis with possible pseudocyst and+gallstones -patient with at least 2 months of recurrent pancreatitis felt to be secondary to gallstones -Triglyceride 210 on presentation - she initially kept Npo, on ivf, imipenem -gi started clears on 7/14 -Leukocytosis down from 16.6 on admission to 12.6, no pain, no vomiting today -general surgery signed off , per general surgery "patient will need lap chole at some point once her pancreatitis, stranding, and fluid collections resolve.  This may be in weeks to months.  She may follow up with the surgeons at [facility] for this procedure" -per GI patient may need postpyloric tube feeds or TPN if not able to tolerate nutrition by mouth, will follow GI recommendation.  AKI on CKDII -Cr baseline around 1.1-1.3  Bun 14/Cr 1.96 on presentation  -bun 8/cr 1.76 today -ua with rare bacteria, denies urinary symptom, no urine culture obtained , she is already on abx.   R sided hydronephrosis without obstruction - suspected related to pancreatitis per OSH radiology report. No urinary sx at this time. Persistent L sided atrophy of L kidney (unchanged)             - continue to monitor: if develops urinary sx, repeat CT scan and consult urology   Elevated am blood glucose,   a1c 6.1  Anemia hgb 8.4-7.5-7.5 Asymptomatic, continue monitor  Body mass index is 46.13 kg/m. meet morbid obesity criteria.    Hx of depression - currently not on any medication             - monitor, currently no acute depressive sx but low threshold to start SSRI if concern for postpartum depression     Code Status:  full  Family Communication: patient and significant other  Disposition Plan: need gi clearance for discharge   Consultants:  General surgery  [facility]  Procedures:  none  Antibiotics:  Meropenem    Objective: BP 134/68 (BP Location: Left Arm)   Pulse 72   Temp 97.9 F (36.6 C) (Oral)   Resp 16   Ht 5\' 4"  (1.626 m)   Wt 121.9 kg (268 lb 11.9 oz)   LMP 05/24/2017 (Within Weeks)   SpO2 97%   BMI 46.13 kg/m   Intake/Output Summary (Last 24 hours) at 02/05/2018 8119 Last data filed at 02/05/2018 0700 Gross per 24 hour  Intake 3909.01 ml  Output -  Net 3909.01 ml   Filed Weights   02/03/18 1822  Weight: 121.9 kg (268 lb 11.9 oz)    Exam: Patient is examined daily including today on 02/05/2018, exams remain the same as of yesterday except that has changed    General:  NAD, flat affect  Cardiovascular: RRR  Respiratory: CTABL  Abdomen: nontender, no guarding, no rebound, decreased bowel sounds  Musculoskeletal: No Edema  Neuro: alert, oriented   Data Reviewed: Basic Metabolic Panel: Recent Labs  Lab 02/03/18 1925 02/04/18 0520 02/05/18 0649  NA 144 143 144  K 4.1 4.0 4.0  CL 114* 113* 112*  CO2 19* 20* 21*  GLUCOSE 125* 135* 119*  BUN 14 13 8   CREATININE 1.96* 1.93* 1.76*  CALCIUM 7.7* 7.8* 7.8*  MG 1.8  --   --  Liver Function Tests: Recent Labs  Lab 02/03/18 1925 02/04/18 0520 02/05/18 0649  AST 15 10* 10*  ALT 10 10 8   ALKPHOS 136* 128* 105  BILITOT 0.7 0.6 0.6  PROT 6.0* 5.7* 7.4  ALBUMIN 1.7* 1.6* 1.6*   Recent Labs  Lab 02/03/18 2133 02/05/18 0649  LIPASE 147* 108*   No results for input(s): AMMONIA in the last 168 hours. CBC: Recent Labs  Lab 02/03/18 1925 02/04/18 0520 02/05/18 0649  WBC 16.6* 14.7* 12.6*  NEUTROABS 14.8*  --   --   HGB 8.4* 7.5* 7.2*  HCT 27.7* 24.2* 23.3*  MCV 90.2 88.6 88.6  PLT 211 150 148*   Cardiac Enzymes:   No results for input(s): CKTOTAL, CKMB, CKMBINDEX, TROPONINI in the last  168 hours. BNP (last 3 results) No results for input(s): BNP in the last 8760 hours.  ProBNP (last 3 results) No results for input(s): PROBNP in the last 8760 hours.  CBG: No results for input(s): GLUCAP in the last 168 hours.  No results found for this or any previous visit (from the past 240 hour(s)).   Studies: No results found.  Scheduled Meds: . enoxaparin (LOVENOX) injection  40 mg Subcutaneous Q24H  . lipase/protease/amylase  36,000 Units Oral TID WC  . pantoprazole  40 mg Oral Daily    Continuous Infusions: . lactated ringers 150 mL/hr at 02/05/18 0700  . meropenem (MERREM) IV 1 g (02/05/18 0527)     Time spent: 25 mins I have personally reviewed and interpreted on  02/05/2018 daily labs,  imagings as discussed above under date review session and assessment and plans.  I reviewed all nursing notes, pharmacy notes, consultant notes,  vitals, pertinent old records  I have discussed plan of care as described above with RN , patient and family on 02/05/2018   Albertine GratesFang Shondra Capps MD, PhD  [facility] Pager [phone]. If 7PM-7AM, please contact night-coverage at [email or web address], password [email or web address] 02/05/2018, 8:22 AM  LOS: 2 days

## 2018-02-05 NOTE — Progress Notes (Signed)
Patient seen and discussed with the resident and she is doing better and tolerating clear liquids and currently she is walking in the halls and her case discussed with the surgical team and she has no new complaints and please see resident's note for details of our visit

## 2018-02-05 NOTE — Progress Notes (Signed)
[facility] Progress Note  Sharon BeltonKayla Duncan 27 y.o. 11/25/1990  CC:  N/V and Abdominal pain  Subjective: Patient doing well this AM. She was able to tolerate some chicken broth and jello yesterday without developing N/V or abdominal pain. Feels that she is improving as at the prior hospital she was not able to tolerate jello. Is asking when she can go home. Discussed slowly escalating her diet.   ROS : Denies fevers, chills, N/V, abdominal pain, constipation, diarrhea.   Objective: Vital signs in last 24 hours: Vitals:   02/04/18 2055 02/05/18 0532  BP: 127/73 134/68  Pulse: 65 72  Resp: 18 16  Temp: 97.9 F (36.6 C) 97.9 F (36.6 C)  SpO2: 100% 97%   General: Obese female in no acute distress Pulm: Good air movement with no wheezing or crackles  CV: RRR, no murmurs, no rubs  Abdomen: Hypoactive bowel sounds, soft, non-distended, no tenderness to palpation  Extremities:No LE edema  Skin: Warm and dry  Neuro: Alert and oriented x 3  Lab Results: Recent Labs    02/03/18 1925 02/04/18 0520 02/05/18 0649  NA 144 143 144  K 4.1 4.0 4.0  CL 114* 113* 112*  CO2 19* 20* 21*  GLUCOSE 125* 135* 119*  BUN 14 13 8   CREATININE 1.96* 1.93* 1.76*  CALCIUM 7.7* 7.8* 7.8*  MG 1.8  --   --    Recent Labs    02/04/18 0520 02/05/18 0649  AST 10* 10*  ALT 10 8  ALKPHOS 128* 105  BILITOT 0.6 0.6  PROT 5.7* 7.4  ALBUMIN 1.6* 1.6*   Recent Labs    02/03/18 1925 02/04/18 0520 02/05/18 0649  WBC 16.6* 14.7* 12.6*  NEUTROABS 14.8*  --   --   HGB 8.4* 7.5* 7.2*  HCT 27.7* 24.2* 23.3*  MCV 90.2 88.6 88.6  PLT 211 150 148*   Recent Labs    02/05/18 0649  LABPROT 15.1  INR 1.20   Assessment/Plan:  Sharon BeltonKayla Duncan is a 27 y.o female who presented as a transfer from an OSH with abdominal pain and N/V felt to be secondary to acute complicated pancreatitis.   Complicated Pancreatitis  - CT abdomen with 5.3 x 6.9 x 7.75 cm mass suggestive of an early pseudocyst -  Per patient abdominal pain and N/V improving  - Leukocytosis improving, likely SIRS reaction as opposed to infection. Okay to stop Abx  - Would continue clear liquid diet today with supplements (boost) for today and plan to advance to full liquid tomorrow if she tolerates today  - Continue pancreatic enzyme replacement - Surgery planning outpatient follow-up  - No indication for MRCP or ERCP at this point. Will need follow-up scan in the future and would recommend MRI or MRCP at that point.   Will discuss the case with Dr. Ewing SchleinMagod.   Levora DredgeJustin Yanira Tolsma MD 02/05/2018, 9:11 AM

## 2018-02-05 NOTE — Progress Notes (Signed)
Patient ID: Graciella BeltonKayla Scheib, female   DOB: 03/26/1991, 26 y.o.   MRN: [id number]       Subjective: Pt a bit shaky this morning, but feels better.  Tolerated some clear liquids with no increase in abdominal pain or nausea, but was given zofran prior to eating.    Objective: Vital signs in last 24 hours: Temp:  [97.9 F (36.6 C)] 97.9 F (36.6 C) (07/15 0532) Pulse Rate:  [65-72] 72 (07/15 0532) Resp:  [16-18] 16 (07/15 0532) BP: (127-134)/(68-73) 134/68 (07/15 0532) SpO2:  [97 %-100 %] 97 % (07/15 0532) Last BM Date: 02/04/18  Intake/Output from previous day: 07/14 0701 - 07/15 0700 In: 3909 [I.V.:3501.8; IV Piggyback:407.2] Out: -  Intake/Output this shift: No intake/output data recorded.  PE: Heart: regular Lungs: CTAB Abd: soft, obese, NT, ND, +BS  Lab Results:  Recent Labs    02/04/18 0520 02/05/18 0649  WBC 14.7* 12.6*  HGB 7.5* 7.2*  HCT 24.2* 23.3*  PLT 150 148*   BMET Recent Labs    02/04/18 0520 02/05/18 0649  NA 143 144  K 4.0 4.0  CL 113* 112*  CO2 20* 21*  GLUCOSE 135* 119*  BUN 13 8  CREATININE 1.93* 1.76*  CALCIUM 7.8* 7.8*   PT/INR Recent Labs    02/05/18 0649  LABPROT 15.1  INR 1.20   CMP     Component Value Date/Time   NA 144 02/05/2018 0649   K 4.0 02/05/2018 0649   CL 112 (H) 02/05/2018 0649   CO2 21 (L) 02/05/2018 0649   GLUCOSE 119 (H) 02/05/2018 0649   BUN 8 02/05/2018 0649   CREATININE 1.76 (H) 02/05/2018 0649   CALCIUM 7.8 (L) 02/05/2018 0649   PROT 7.4 02/05/2018 0649   ALBUMIN 1.6 (L) 02/05/2018 0649   AST 10 (L) 02/05/2018 0649   ALT 8 02/05/2018 0649   ALKPHOS 105 02/05/2018 0649   BILITOT 0.6 02/05/2018 0649   GFRNONAA 39 (L) 02/05/2018 0649   GFRAA 45 (L) 02/05/2018 0649   Lipase     Component Value Date/Time   LIPASE 108 (H) 02/05/2018 0649       Studies/Results: Koreas Abdomen Limited Ruq  Result Date: 02/04/2018 CLINICAL DATA:  Common bile duct obstruction EXAM: ULTRASOUND ABDOMEN LIMITED RIGHT UPPER  QUADRANT COMPARISON:  None. FINDINGS: Gallbladder: Gallbladder sludge with small gallstones. Negative sonographic Murphy sign. No gallbladder wall thickening. Common bile duct: Diameter: Poorly visualized common bile duct without dilatation. 3.4 mm. Liver: Diffusely increased echogenicity liver. No focal liver lesion. Portal vein is patent on color Doppler imaging with normal direction of blood flow towards the liver. Minimal free fluid Exam limited by obesity IMPRESSION: Gallbladder sludge and gallstones without evidence of acute cholecystitis. No biliary dilatation Hepatic steatosis Electronically Signed   By: Marlan Palauharles  Clark M.D.   On: 02/04/2018 07:53    Anti-infectives: Anti-infectives (From admission, onward)   Start     Dose/Rate Route Frequency Ordered Stop   02/04/18 0600  meropenem (MERREM) 1 g in sodium chloride 0.9 % 100 mL IVPB     1 g 200 mL/hr over 30 Minutes Intravenous Every 8 hours 02/03/18 1959     02/03/18 2100  meropenem (MERREM) 1 g in sodium chloride 0.9 % 100 mL IVPB     1 g 200 mL/hr over 30 Minutes Intravenous  Once 02/03/18 2007 02/03/18 2315   02/03/18 1915  ertapenem [facility](INVANZ) injection 1,000 mg  Status:  Discontinued     1 g Intramuscular Every 24 hours  02/03/18 1911 02/03/18 1958       Assessment/Plan Anemia of unclear etiology Elevated creatinine- unknown patient's baseline creatinine Significant left kidney atrophy Right-sided hydronephrosis likely secondary to pancreatitis Urinary tract infection Significant protein calorie malnutrition Gallstones  Acute pancreatitis with pseudocyst -patient with at least 2 months of recurrent pancreatitis felt to be secondary to gallstones. -1 week post partum -patient will need lap chole at some point once her pancreatitis, stranding, and fluid collections resolve.  This may be in weeks to months.  She may follow up with the surgeons at [facility] for this procedure. -diet per GI.  Agree that if she can not tolerate PO  that she may need postpyloric tube for feedings or TNA, although TNA is not as nutritionally good as enteral feeds.   -no surgical indications at this time.  Will defer further management to GI and medicine.  Please call us back if we can be of further assistance.   LOS: 2 days    Letha Cape , [facility] 02/05/2018, 8:46 AM Pager: [phone]

## 2018-02-06 LAB — CBC
HCT: 25 % — ABNORMAL LOW (ref 36.0–46.0)
HEMOGLOBIN: 7.8 g/dL — AB (ref 12.0–15.0)
MCH: 27.1 pg (ref 26.0–34.0)
MCHC: 31.2 g/dL (ref 30.0–36.0)
MCV: 86.8 fL (ref 78.0–100.0)
Platelets: 173 10*3/uL (ref 150–400)
RBC: 2.88 MIL/uL — ABNORMAL LOW (ref 3.87–5.11)
RDW: 14.4 % (ref 11.5–15.5)
WBC: 12.1 10*3/uL — AB (ref 4.0–10.5)

## 2018-02-06 LAB — LIPASE, BLOOD: Lipase: 115 U/L — ABNORMAL HIGH (ref 11–51)

## 2018-02-06 LAB — COMPREHENSIVE METABOLIC PANEL
ALBUMIN: 1.7 g/dL — AB (ref 3.5–5.0)
ALT: 11 U/L (ref 0–44)
AST: 14 U/L — ABNORMAL LOW (ref 15–41)
Alkaline Phosphatase: 99 U/L (ref 38–126)
Anion gap: 11 (ref 5–15)
BILIRUBIN TOTAL: 0.4 mg/dL (ref 0.3–1.2)
BUN: 7 mg/dL (ref 6–20)
CO2: 24 mmol/L (ref 22–32)
Calcium: 7.9 mg/dL — ABNORMAL LOW (ref 8.9–10.3)
Chloride: 107 mmol/L (ref 98–111)
Creatinine, Ser: 1.55 mg/dL — ABNORMAL HIGH (ref 0.44–1.00)
GFR calc Af Amer: 53 mL/min — ABNORMAL LOW (ref >60)
GFR calc non Af Amer: 46 mL/min — ABNORMAL LOW (ref >60)
GLUCOSE: 121 mg/dL — AB (ref 70–99)
Potassium: 3.9 mmol/L (ref 3.5–5.1)
Sodium: 142 mmol/L (ref 135–145)
TOTAL PROTEIN: 5.6 g/dL — AB (ref 6.5–8.1)

## 2018-02-06 NOTE — Progress Notes (Signed)
Sharon Duncan 4:44 PM  Subjective: Patient feeling much better and tolerating her diet and has no new complaints and case discussed with our resident  Objective: Vital signs stable afebrile no acute distress abdomen is soft nontender labs stable creatinine decreasing  Assessment: Gallstone pancreatitis  Plan: If doing well in a.m. Will advance diet further and hopefully she can go home soon and would continue pancreatic enzymes and follow-up with both myself and the surgeons in a few weeks  [facility] Hospital[facility]OD,Nochum Fenter E  Pager [phone] After 5PM or if no answer call [phone]

## 2018-02-06 NOTE — Progress Notes (Signed)
[facility] Progress Note  Sharon Duncan 27 y.o. 12/29/1990  CC:  N/V and abdominal pain  Subjective: Patient doing better this AM. She tolerated clear liquids yesterday without recurrent abdominal pain. She did have some nausea yesterday evening. Feels hungry and would like to try to advance her diet. No diarrhea, is having "cramping pain."   ROS : Denies fevers, chills, epigastric abdominal pain, diarrhea  Objective: Vital signs in last 24 hours: Vitals:   02/05/18 2204 02/06/18 0536  BP: (!) 143/86 137/75  Pulse: 78 70  Resp: 18 18  Temp: 98.4 F (36.9 C) 97.9 F (36.6 C)  SpO2: 100% 98%   General: Obese female in no acute distress Pulm: Good air movement with no wheezing or crackles  CV: RRR, no murmurs, no rubs  Abdomen: Active bowel sounds, soft, non-distended, no tenderness to palpation   Lab Results: Recent Labs    02/03/18 1925  02/05/18 0649 02/06/18 0342  NA 144   < > 144 142  K 4.1   < > 4.0 3.9  CL 114*   < > 112* 107  CO2 19*   < > 21* 24  GLUCOSE 125*   < > 119* 121*  BUN 14   < > 8 7  CREATININE 1.96*   < > 1.76* 1.55*  CALCIUM 7.7*   < > 7.8* 7.9*  MG 1.8  --   --   --    < > = values in this interval not displayed.   Recent Labs    02/05/18 0649 02/06/18 0342  AST 10* 14*  ALT 8 11  ALKPHOS 105 99  BILITOT 0.6 0.4  PROT 7.4 5.6*  ALBUMIN 1.6* 1.7*   Recent Labs    02/03/18 1925  02/05/18 0649 02/06/18 0342  WBC 16.6*   < > 12.6* 12.1*  NEUTROABS 14.8*  --   --   --   HGB 8.4*   < > 7.2* 7.8*  HCT 27.7*   < > 23.3* 25.0*  MCV 90.2   < > 88.6 86.8  PLT 211   < > 148* 173   < > = values in this interval not displayed.   Recent Labs    02/05/18 0649  LABPROT 15.1  INR 1.20   Assessment/Plan:  Sharon Duncan is a 27 y.o female who presented as a transfer from an OSH with abdominal pain and N/V felt to be secondary to acute complicated pancreatitis.   Complicated Pancreatitis  - CT abdomen with 5.3 x 6.9 x 7.75 cm  mass suggestive of an early pseudocyst - Abdominal pain and N/V improving  - Leukocytosis improving  - Stop antibiotics  - Continue pancreatic enzyme replacement  - Advance diet as tolerated - Will need cholecystectomy as outpatient   Will discuss the case with Dr. Ewing SchleinMagod.  Levora DredgeJustin Bilbo Carcamo MD 02/06/2018, 8:07 AM

## 2018-02-06 NOTE — Progress Notes (Signed)
PROGRESS NOTE  Graciella BeltonKayla Engelbert AOZ:[id number] DOB: 02/29/1991 DOA: 02/03/2018 PCP: System, Pcp Not In  HPI/Recap of past 24 hours:  She is feeling better, tolerated clears,  + flatus, reports loose stool, some nausea, denies ab pain, no vomiting   no fever   Assessment/Plan: Active Problems:   Acute recurrent pancreatitis   Acute recurrent pancreatitis with possible pseudocyst and+gallstones -patient with at least 2 months of recurrent pancreatitis felt to be secondary to gallstones -Triglyceride 210 on presentation - she is started on imipenem on presentation, Leukocytosis down from 16.6 on admission to 12.6, no pain, no vomiting today. abx discontinued per BI recommendation -general surgery signed off , per general surgery "patient will need lap chole at some point once her pancreatitis, stranding, and fluid collections resolve.  This may be in weeks to months.  She [facility]" -sHe is improving  likely able to discharge home if tolerated diet advancement  - diet advancement per GI  -Need outpatient GI and general surgery follow-up  AKI on CKDII -Cr baseline around 1.1-1.3  Bun 14/Cr 1.96 on presentation  -bun 7/cr 1.55 today -ua with rare bacteria, denies urinary symptom, no urine culture obtained , she denies urinary symptom.  -sHe is received imipenem in the hospital    R sided hydronephrosis without obstruction - suspected related to pancreatitis per OSH radiology report. No urinary sx at this time. Persistent L sided atrophy of L kidney (unchanged)             - continue to monitor: if develops urinary sx, repeat CT scan and consult urology   Elevated am blood glucose,   a1c 6.1  Anemia hgb 8.4-7.5-7.5 Asymptomatic, continue monitor  Body mass index is 46.13 kg/m. meet morbid obesity criteria.    Hx of depression - currently not on any medication             - monitor, currently no acute depressive sx but low  threshold to start SSRI if concern for postpartum depression     Code Status: full  Family Communication: patient and significant other  Disposition Plan: need gi clearance for discharge, possible on 7/17   Consultants:  General surgery  [facility]  Procedures:  none  Antibiotics:  Meropenem    Objective: BP (!) 150/93 (BP Location: Left Arm)   Pulse 70   Temp 97.9 F (36.6 C) (Oral)   Resp 18   Ht 5\' 4"  (1.626 m)   Wt 121.9 kg (268 lb 11.9 oz)   LMP 05/24/2017 (Within Weeks)   SpO2 100%   BMI 46.13 kg/m   Intake/Output Summary (Last 24 hours) at 02/06/2018 1815 Last data filed at 02/05/2018 2220 Gross per 24 hour  Intake 870 ml  Output -  Net 870 ml   Filed Weights   02/03/18 1822  Weight: 121.9 kg (268 lb 11.9 oz)    Exam: Patient is examined daily including today on 02/06/2018, exams remain the same as of yesterday except that has changed    General:  NAD, pleasant  Cardiovascular: RRR  Respiratory: CTABL  Abdomen: nontender, no guarding, no rebound, decreased bowel sounds  Musculoskeletal: No Edema  Neuro: alert, oriented   Data Reviewed: Basic Metabolic Panel: Recent Labs  Lab 02/03/18 1925 02/04/18 0520 02/05/18 0649 02/06/18 0342  NA 144 143 144 142  K 4.1 4.0 4.0 3.9  CL 114* 113* 112* 107  CO2 19* 20* 21* 24  GLUCOSE 125*  135* 119* 121*  BUN 14 13 8 7   CREATININE 1.96* 1.93* 1.76* 1.55*  CALCIUM 7.7* 7.8* 7.8* 7.9*  MG 1.8  --   --   --    Liver Function Tests: Recent Labs  Lab 02/03/18 1925 02/04/18 0520 02/05/18 0649 02/06/18 0342  AST 15 10* 10* 14*  ALT 10 10 8 11   ALKPHOS 136* 128* 105 99  BILITOT 0.7 0.6 0.6 0.4  PROT 6.0* 5.7* 7.4 5.6*  ALBUMIN 1.7* 1.6* 1.6* 1.7*   Recent Labs  Lab 02/03/18 2133 02/05/18 0649 02/06/18 0342  LIPASE 147* 108* 115*   No results for input(s): AMMONIA in the last 168 hours. CBC: Recent Labs  Lab 02/03/18 1925 02/04/18 0520 02/05/18 0649 02/06/18 0342  WBC 16.6*  14.7* 12.6* 12.1*  NEUTROABS 14.8*  --   --   --   HGB 8.4* 7.5* 7.2* 7.8*  HCT 27.7* 24.2* 23.3* 25.0*  MCV 90.2 88.6 88.6 86.8  PLT 211 150 148* 173   Cardiac Enzymes:   No results for input(s): CKTOTAL, CKMB, CKMBINDEX, TROPONINI in the last 168 hours. BNP (last 3 results) No results for input(s): BNP in the last 8760 hours.  ProBNP (last 3 results) No results for input(s): PROBNP in the last 8760 hours.  CBG: No results for input(s): GLUCAP in the last 168 hours.  No results found for this or any previous visit (from the past 240 hour(s)).   Studies: No results found.  Scheduled Meds: . enoxaparin (LOVENOX) injection  40 mg Subcutaneous Q24H  . lipase/protease/amylase  36,000 Units Oral TID WC  . pantoprazole  40 mg Oral Daily    Continuous Infusions: . meropenem (MERREM) IV 1 g (02/06/18 1404)     Time spent: 15 mins I have personally reviewed and interpreted on  02/06/2018 daily labs,  imagings as discussed above under date review session and assessment and plans.  I reviewed all nursing notes, pharmacy notes, consultant notes,  vitals, pertinent old records  I have discussed plan of care as described above with RN , patient  on 02/06/2018   Albertine Grates MD, PhD  [facility] Pager [phone]. If 7PM-7AM, please contact night-coverage at [email or web address], password [facility] 02/06/2018, 6:15 PM  LOS: 3 days

## 2018-02-06 NOTE — Plan of Care (Signed)
Nutrition Education Note  RD educated patient today regarding a pancreatitis diet.  Spoke with Sharon Duncan at bedside who is concerned about when she will discharge. Sharon Duncan reports that she had a baby girl 2 weeks ago and is concerned that her boyfriend will lose his job if he does not go back to work. Sharon Duncan states that she is feeling much better and has been tolerating full liquids well. Sharon Duncan states that full liquids cause her to be less nauseous than clear liquids.  Discussed Sharon Duncan's usual daily intake. Sharon Duncan states she eats 2-3 meals daily. Breakfast is either skipped or includes toast, eggs, and bacon. Lunch and dinner are "light" and might include a sandwich.   RD provided "Nutrition Therapy with Pancreatitis" handout from the Academy of [employer] and Dietetics. RD also provided a list of low fat vs high fat foods. Reviewed patient's dietary recall. Advised patient to eat multiple small meals and to avoid high fat foods, spicy foods, and alcohol. Educated Sharon Duncan on the importance of adequate protein intake needed to preserve lean muscle. Gave patient examples of meals and menu planning.    Teach back method used.  Expect fair compliance.  Body mass index is 46.13 kg/m. Sharon Duncan meets criteria for Obesity Class III based on current BMI.  Current diet order is Full Liquid. No meal completion charted since 02/04/18. Labs and medications reviewed. No further nutrition interventions warranted at this time. RD contact information provided. If additional nutrition issues arise, please re-consult RD.   Earma ReadingKate Jablonski Maribel Hadley, MS, RD, LDN Pager: [phone] Weekend/After Hours: [phone]\

## 2018-02-07 DIAGNOSIS — K859 Acute pancreatitis without necrosis or infection, unspecified: Secondary | ICD-10-CM

## 2018-02-07 LAB — LIPASE, BLOOD: LIPASE: 118 U/L — AB (ref 11–51)

## 2018-02-07 LAB — COMPREHENSIVE METABOLIC PANEL
ALBUMIN: 1.8 g/dL — AB (ref 3.5–5.0)
ALK PHOS: 91 U/L (ref 38–126)
ALT: 10 U/L (ref 0–44)
ANION GAP: 10 (ref 5–15)
AST: 15 U/L (ref 15–41)
BUN: 5 mg/dL — ABNORMAL LOW (ref 6–20)
CALCIUM: 7.9 mg/dL — AB (ref 8.9–10.3)
CHLORIDE: 106 mmol/L (ref 98–111)
CO2: 25 mmol/L (ref 22–32)
Creatinine, Ser: 1.53 mg/dL — ABNORMAL HIGH (ref 0.44–1.00)
GFR calc Af Amer: 54 mL/min — ABNORMAL LOW (ref >60)
GFR calc non Af Amer: 46 mL/min — ABNORMAL LOW (ref >60)
Glucose, Bld: 132 mg/dL — ABNORMAL HIGH (ref 70–99)
Potassium: 3.5 mmol/L (ref 3.5–5.1)
Sodium: 141 mmol/L (ref 135–145)
Total Bilirubin: 0.6 mg/dL (ref 0.3–1.2)
Total Protein: 6.1 g/dL — ABNORMAL LOW (ref 6.5–8.1)

## 2018-02-07 LAB — CBC
HCT: 26.5 % — ABNORMAL LOW (ref 36.0–46.0)
HEMOGLOBIN: 8.3 g/dL — AB (ref 12.0–15.0)
MCH: 27 pg (ref 26.0–34.0)
MCHC: 31.3 g/dL (ref 30.0–36.0)
MCV: 86.3 fL (ref 78.0–100.0)
Platelets: 192 10*3/uL (ref 150–400)
RBC: 3.07 MIL/uL — ABNORMAL LOW (ref 3.87–5.11)
RDW: 14.3 % (ref 11.5–15.5)
WBC: 9.6 10*3/uL (ref 4.0–10.5)

## 2018-02-07 NOTE — Progress Notes (Signed)
[facility] Progress Note  Sharon BeltonKayla Duncan 26 y.o. 02/27/1991  CC:  N/V and abdominal pain  Subjective: Patient feels well this AM. She reports some nausea last night but was able to tolerate PO intake without abdominal pain. She continues to express desire to advance her diet and go home.   She is requesting to be discharged tomorrow AM to arrange transportation as she does not live in [place]. She would like to continue to follow her in [place]. We discussed pancreatic enzyme replacement.   ROS : Denies abdominal pain, vomiting, SOB, palpitations, fevers, chills.   Objective: Vital signs in last 24 hours: Vitals:   02/06/18 2118 02/07/18 0514  BP: 132/75 122/62  Pulse: 77 74  Resp:    Temp: 97.7 F (36.5 C) 98.1 F (36.7 C)  SpO2: 99% 96%   General: Obese female in no acute distress Pulm: Good air movement with no wheezing or crackles  CV: RRR, no murmurs, no rubs  Abdomen: Active bowel sounds, soft, non-distended, no tenderness to palpation   Lab Results: Recent Labs    02/06/18 0342 02/07/18 0549  NA 142 141  K 3.9 3.5  CL 107 106  CO2 24 25  GLUCOSE 121* 132*  BUN 7 5*  CREATININE 1.55* 1.53*  CALCIUM 7.9* 7.9*   Recent Labs    02/06/18 0342 02/07/18 0549  AST 14* 15  ALT 11 10  ALKPHOS 99 91  BILITOT 0.4 0.6  PROT 5.6* 6.1*  ALBUMIN 1.7* 1.8*   Recent Labs    02/06/18 0342 02/07/18 0549  WBC 12.1* 9.6  HGB 7.8* 8.3*  HCT 25.0* 26.5*  MCV 86.8 86.3  PLT 173 192   Recent Labs    02/05/18 0649  LABPROT 15.1  INR 1.20   Assessment/Plan:  Sharon BeltonKayla Duncan is a 27 y.o female who presented as a transfer from an OSH with abdominal pain and N/V felt to be secondary to acute complicated pancreatitis.   Complicated Pancreatitis  - CT abdomen with5.3 x 6.9 x 7.75 cmmasssuggestive of an early pseudocyst - Abdominal pain and N/V improving  - Leukocytosis resolved - Okay to stop antibiotic as there is no signs that the pseudocyst is  infected or that there is necrosis on CT  - Consult case management to ensure pancreatic enzyme replacement is covered by patient's insurance - Start low fat diet today  - Recommend monitoring overnight and if no recurrence of abdominal pain or N/V okay from a GI standpoint to discharge in AM - Will need cholecystectomy as outpatient   Will discuss the case with Dr. Ewing SchleinMagod.  Levora DredgeJustin Cledith Kamiya MD 02/07/2018, 7:40 AM

## 2018-02-07 NOTE — Progress Notes (Signed)
PROGRESS NOTE  Sharon Duncan  XBJ:[id number] DOB: 02/03/1991 DOA: 02/03/2018 PCP: System, Pcp Not In   Brief Narrative: Sharon Duncan is a 27 y.o. female with a history of gallstone pancreatitis, s/p NSVD at term 01/26/2018 who developed persistent nausea and abdominal pain. [facility] ED where CT abd/pelvis demonstrated large pancreatitic pseudocyst or phlegmon measuring 5 x 6 x 7 cm with significant peri-pancreatitic stranding. Received zofran, 1 dose of IV zosyn, and PO oxycodone. [facility] special[facility] services [facility].     Assessment & Plan: Active Problems:   Acute recurrent pancreatitis  Acute gallstone pancreatitis, pancreatic insufficiency: Developing pseudocyst noted without compressive symptoms or symptoms of systemic infection/necrosis.  - Leukocytosis resolved. Agree that discontinuing antibiotics is reasonable. Return precautions reviewed and used teach back w/pt and SO.  - Advancing diet as tolerated, per GI likely home tomorrow. Will need GI follow up. 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 CrCl now ~5470ml/min.  - Continue to monitor.   Normocytic anemia: Hgb stable.  - Check CBC at follow up. Would benefit from po iron postpartum  Pyuria: Asymptomatic. Received imipenem while admitted. No culture sent.   Right hydronephrosis: Per report of OSH, suspected sequela of pancreatitis. Cr at baseline, UOP ok.  - Recommend attention at follow up with urology referral if this persists.   Prediabetes: HbA1c 6.1%, possibly due to pancreatic insufficiency, may improve.  - PCP follow up recommended  Morbid obesity:  - Dietitian consulted  GERD:  - PPI  History of depression: At risk of postpartum depression.  - No medications, just discussed with patient need for screening post partum.   Postpartum: Uncomplicated delivery, not breast feeding.  -  Routine post partum care recommended.  DVT prophylaxis: Lovenox Code Status: Full Family Communication: Significant other at bedside.  Disposition Plan: Home if tolerating diet in AM  Consultants:   GI  General surgery  Procedures:   None  Antimicrobials:  Imipenem 7/13 - 7/16  Subjective: No fevers, abd pain improved, very mild epigastric. Some nausea without emesis when eating/drinking, improved. Ready to go home soon.   Objective: Vitals:   02/06/18 1432 02/06/18 2118 02/07/18 0514 02/07/18 1440  BP: (!) 150/93 132/75 122/62 140/86  Pulse: 70 77 74 85  Resp: 18   18  Temp:  97.7 F (36.5 C) 98.1 F (36.7 C) 97.8 F (36.6 C)  TempSrc: Oral Oral Oral Oral  SpO2: 100% 99% 96% 99%  Weight:      Height:       No intake or output data in the 24 hours ending 02/07/18 1733 Filed Weights   02/03/18 1822  Weight: 121.9 kg (268 lb 11.9 oz)    Gen: Pleasant, obese female in no distress  Pulm: Non-labored breathing . Clear to auscultation bilaterally.  CV: Regular rate and rhythm. No murmur, rub, or gallop. No JVD, no pedal edema. GI: Abdomen soft, non-tender, non-distended, with normoactive bowel sounds. No organomegaly or masses felt. Ext: Warm, no deformities Skin: No rashes, lesions or ulcers Neuro: Alert and oriented. No focal neurological deficits. Psych: Judgement and insight appear normal. Mood & affect appropriate.   Data Reviewed: I have personally reviewed following labs and imaging studies  CBC: Recent Labs  Lab 02/03/18 1925 02/04/18 0520 02/05/18 0649 02/06/18 0342 02/07/18 0549  WBC 16.6* 14.7* 12.6* 12.1* 9.6  NEUTROABS 14.8*  --   --   --   --  HGB 8.4* 7.5* 7.2* 7.8* 8.3*  HCT 27.7* 24.2* 23.3* 25.0* 26.5*  MCV 90.2 88.6 88.6 86.8 86.3  PLT 211 150 148* 173 192   Basic Metabolic Panel: Recent Labs  Lab 02/03/18 1925 02/04/18 0520 02/05/18 0649 02/06/18 0342 02/07/18 0549  NA 144 143 144 142 141  K 4.1 4.0 4.0 3.9 3.5  CL 114*  113* 112* 107 106  CO2 19* 20* 21* 24 25  GLUCOSE 125* 135* 119* 121* 132*  BUN 14 13 8 7  5*  CREATININE 1.96* 1.93* 1.76* 1.55* 1.53*  CALCIUM 7.7* 7.8* 7.8* 7.9* 7.9*  MG 1.8  --   --   --   --    GFR: Estimated Creatinine Clearance: 72.4 mL/min (A) (by C-G formula based on SCr of 1.53 mg/dL (H)). Liver Function Tests: Recent Labs  Lab 02/03/18 1925 02/04/18 0520 02/05/18 0649 02/06/18 0342 02/07/18 0549  AST 15 10* 10* 14* 15  ALT 10 10 8 11 10   ALKPHOS 136* 128* 105 99 91  BILITOT 0.7 0.6 0.6 0.4 0.6  PROT 6.0* 5.7* 7.4 5.6* 6.1*  ALBUMIN 1.7* 1.6* 1.6* 1.7* 1.8*   Recent Labs  Lab 02/03/18 2133 02/05/18 0649 02/06/18 0342 02/07/18 0549  LIPASE 147* 108* 115* 118*   No results for input(s): AMMONIA in the last 168 hours. Coagulation Profile: Recent Labs  Lab 02/05/18 0649  INR 1.20   Cardiac Enzymes: No results for input(s): CKTOTAL, CKMB, CKMBINDEX, TROPONINI in the last 168 hours. BNP (last 3 results) No results for input(s): PROBNP in the last 8760 hours. HbA1C: Recent Labs    02/05/18 0649  HGBA1C 6.1*   CBG: No results for input(s): GLUCAP in the last 168 hours. Lipid Profile: No results for input(s): CHOL, HDL, LDLCALC, TRIG, CHOLHDL, LDLDIRECT in the last 72 hours. Thyroid Function Tests: No results for input(s): TSH, T4TOTAL, FREET4, T3FREE, THYROIDAB in the last 72 hours. Anemia Panel: No results for input(s): VITAMINB12, FOLATE, FERRITIN, TIBC, IRON, RETICCTPCT in the last 72 hours. Urine analysis:    Component Value Date/Time   COLORURINE YELLOW 02/04/2018 1351   APPEARANCEUR HAZY (A) 02/04/2018 1351   LABSPEC 1.010 02/04/2018 1351   PHURINE 6.0 02/04/2018 1351   GLUCOSEU NEGATIVE 02/04/2018 1351   HGBUR LARGE (A) 02/04/2018 1351   BILIRUBINUR NEGATIVE 02/04/2018 1351   KETONESUR 20 (A) 02/04/2018 1351   PROTEINUR 30 (A) 02/04/2018 1351   NITRITE NEGATIVE 02/04/2018 1351   LEUKOCYTESUR LARGE (A) 02/04/2018 1351   No results found  for this or any previous visit (from the past 240 hour(s)).    Radiology Studies: No results found.  Scheduled Meds: . enoxaparin (LOVENOX) injection  40 mg Subcutaneous Q24H  . lipase/protease/amylase  36,000 Units Oral TID WC  . pantoprazole  40 mg Oral Daily   Continuous Infusions:   LOS: 4 days   Time spent: 25 minutes.  Tyrone Nine, MD [facility] [email or web address] Password [facility] 02/07/2018, 5:33 PM

## 2018-02-07 NOTE — Progress Notes (Signed)
Rubyann Boak 12:09 PM  Subjective: Patient doing well and tolerating diet slow advancement and has no new complaints and bowels are getting firmer and case discussed with her significant other  Objective: Vital signs stable afebrile no acute distress abdomen is soft nontender labs stable  Assessment: Gallstone pancreatitis currently improved  Plan: It has a good day today can go home tomorrow the warnings were discussed with the patient and her significant other and would keep on pancreatic enzymes and happy to see back in the office in a few weeks and would like to follow-up with her [facility] as well for probable laparoscopic cholecystectomy in one month if better  [facility],Eagan Shifflett E  Pager [phone] After 5PM or if no answer call [phone]

## 2018-02-08 LAB — LIPASE, BLOOD: Lipase: 110 U/L — ABNORMAL HIGH (ref 11–51)

## 2018-02-08 LAB — COMPREHENSIVE METABOLIC PANEL WITH GFR
ALT: 10 U/L (ref 0–44)
AST: 16 U/L (ref 15–41)
Albumin: 2.1 g/dL — ABNORMAL LOW (ref 3.5–5.0)
Alkaline Phosphatase: 95 U/L (ref 38–126)
Anion gap: 11 (ref 5–15)
BUN: 7 mg/dL (ref 6–20)
CO2: 26 mmol/L (ref 22–32)
Calcium: 8.3 mg/dL — ABNORMAL LOW (ref 8.9–10.3)
Chloride: 106 mmol/L (ref 98–111)
Creatinine, Ser: 1.49 mg/dL — ABNORMAL HIGH (ref 0.44–1.00)
GFR calc Af Amer: 56 mL/min — ABNORMAL LOW
GFR calc non Af Amer: 48 mL/min — ABNORMAL LOW
Glucose, Bld: 132 mg/dL — ABNORMAL HIGH (ref 70–99)
Potassium: 3.9 mmol/L (ref 3.5–5.1)
Sodium: 143 mmol/L (ref 135–145)
Total Bilirubin: 0.4 mg/dL (ref 0.3–1.2)
Total Protein: 6.3 g/dL — ABNORMAL LOW (ref 6.5–8.1)

## 2018-02-08 LAB — CBC
HCT: 28.1 % — ABNORMAL LOW (ref 36.0–46.0)
Hemoglobin: 8.7 g/dL — ABNORMAL LOW (ref 12.0–15.0)
MCH: 27.2 pg (ref 26.0–34.0)
MCHC: 31 g/dL (ref 30.0–36.0)
MCV: 87.8 fL (ref 78.0–100.0)
Platelets: 216 10*3/uL (ref 150–400)
RBC: 3.2 MIL/uL — ABNORMAL LOW (ref 3.87–5.11)
RDW: 14.2 % (ref 11.5–15.5)
WBC: 9.7 10*3/uL (ref 4.0–10.5)

## 2018-02-08 MED ORDER — PANCRELIPASE (LIP-PROT-AMYL) 36000-114000 UNITS PO CPEP: 36000.0000 [IU] | ORAL_CAPSULE | Freq: Three times a day (TID) | ORAL | 0 refills | Status: DC

## 2018-02-08 NOTE — Progress Notes (Signed)
Sharon Duncan 11:06 AM  Subjective: Patient is doing well from a GI standpoint tolerating diet and not having any pain and only 3 loose bowel movements a day and her case discussed with her fianc as well  Objective: Vital signs stable afebrile no acute distress abdomen is soft nontenderlabs okay  Assessment: Gallstone pancreatitis  Plan: Okay with me to go home and I left pancreatic enzymes samples at my office and the surgeons will see her back in 2-3 weeks to set up probably laparoscopic cholecystectomy with Intra-Op cholangiogram and I am happy to see her back as needed or hopefully on the same day as their appointment if it works out and will consider repeat x-rays prior to surgery but if she truly does well could probably hold off and she'll call me sooner when necessary [facility],Sharon Duncan  Pager [phone] After 5PM or if no answer call [phone]

## 2018-02-08 NOTE — Progress Notes (Signed)
Graciella BeltonKayla Joles to be D/C'd Home per MD order.  Discussed with the patient and all questions fully answered.  VSS, Skin clean, dry and intact without evidence of skin break down, no evidence of skin tears noted. IV catheter discontinued intact. Site without signs and symptoms of complications. Dressing and pressure applied.  An After Visit Summary was printed and given to the patient. Patient received prescription.  D/c education completed with patient/family including follow up instructions, medication list, d/c activities limitations if indicated, with other d/c instructions as indicated by MD - patient able to verbalize understanding, all questions fully answered.   Patient instructed to return to ED, call 911, or call MD for any changes in condition.   Patient escorted via WC, and D/C home via private auto.  Marca AnconaLaura M Isaak Delmundo 02/08/2018 12:06 PM

## 2018-02-08 NOTE — Discharge Summary (Signed)
Physician Discharge Summary  Sharon Duncan ZOX:[id number] DOB: 08/09/1990 DOA: 02/03/2018  PCP: System, Pcp Not In  Admit date: 02/03/2018 Discharge date: 02/08/2018  Admitted From: Home by way of [facility] Disposition: Home   Recommendations for Outpatient Follow-up:  1. Follow up with general surgery in 2-3 weeks 2. Follow up with GI in 2-3 weeks.  3. Routine postpartum care 4. Recommend repeat renal U/S to evaluate right hydronephrosis.  Home Health: None Equipment/Devices: None Discharge Condition: Stable CODE STATUS: Full Diet recommendation: Low fat, low residue as tolerated  Brief/Interim Summary: Sharon Duncan is a 27 y.o. female with a history of gallstone pancreatitis, s/p NSVD at term 01/26/2018 who developed persistent nausea and abdominal pain. Presented to [facility] ED where CT abd/pelvis demonstrated large pancreatitic pseudocyst or phlegmon measuring 5 x 6 x 7 cm with significant peri-pancreatitic stranding. Received zofran, 1 dose of IV zosyn, and PO oxycodone. Transferred to [facility] given lack of GI speciality services at [facility]. Supportive care was provided with GI consultation recommending beginning creon. She slowly improved and will plan to follow up with general surgery, who saw her in the hospital, in the next few weeks for eventual laparoscopic cholecystectomy with intraoperative cholangiogram.   Discharge Diagnoses:  Active Problems:   Acute recurrent pancreatitis  Acute recurrent gallstone pancreatitis, pancreatic insufficiency: Developing pseudocyst noted without compressive symptoms or symptoms of systemic infection/necrosis.  - Leukocytosis resolved, not continuing imipenem. Return precautions reviewed and used teach back w/pt and SO.  - Follow up with [facility].  - Follow up with general surgery for eventual laparoscopic cholecystectomy - Creon samples to be provided by GI.  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 CrCl now ~61ml/min.  - Continue to  monitor.   Normocytic anemia: Hgb stable.  - Check CBC at follow up. May benefit from po iron postpartum  Pyuria: Asymptomatic. Received imipenem while admitted. No culture sent.   Right hydronephrosis: Per report of OSH, suspected sequela of pancreatitis. Cr at baseline, UOP ok.  - Recommend attention at follow up with urology referral if this persists.   Prediabetes: HbA1c 6.1%, possibly due to pancreatic insufficiency, may improve.  - PCP follow up recommended  Morbid obesity:  - Dietitian consulted  GERD:  - PPI  History of depression: At risk of postpartum depression.  - No medications, just discussed with patient need for screening post partum.   Postpartum: Uncomplicated delivery, not breast feeding.  - Routine post partum care recommended.  Discharge Instructions Discharge Instructions    Discharge instructions   Complete by:  As directed    Follow up with general surgery and GI as directed.  Take creon with meals to help with digestion.  If your symptoms return or you develop early satiety or fever or vomiting, seek medical attention right away.     Allergies as of 02/08/2018      Reactions   Rocephin [ceftriaxone] Rash      Medication List    TAKE these medications   lipase/protease/amylase 81191 UNITS Cpep capsule Commonly known as:  CREON Take 1 capsule (36,000 Units total) by mouth 3 (three) times daily with meals.   oxyCODONE-acetaminophen 5-325 MG tablet Commonly known as:  PERCOCET/ROXICET Take 1 tablet by mouth every 4 (four) hours as needed for pain. for pain      Follow-up Information    Vida Rigger, MD Follow up.   Specialty:  Gastroenterology Why:  for pancreatitis  Contact information: [address] [place] [address] [phone]  follow up with general surgery Follow up.   Why:  to discuss timing of gallbladder surgery       Surgery, [place]. Schedule an appointment as soon as possible  for a visit in 3 week(s).   Specialty:  General Surgery Contact information: [address] [place] [address] [phone]          Allergies  Allergen Reactions  . Rocephin [Ceftriaxone] Rash    Consultations:  GI  General surgery  Procedures/Studies: US Abdomen Limited Ruq  Result Date: 02/04/2018 CLINICAL DATA:  Common bile duct obstruction EXAM: ULTRASOUND ABDOMEN LIMITED RIGHT UPPER QUADRANT COMPARISON:  None. FINDINGS: Gallbladder: Gallbladder sludge with small gallstones. Negative sonographic Murphy sign. No gallbladder wall thickening. Common bile duct: Diameter: Poorly visualized common bile duct without dilatation. 3.4 mm. Liver: Diffusely increased echogenicity liver. No focal liver lesion. Portal vein is patent on color Doppler imaging with normal direction of blood flow towards the liver. Minimal free fluid Exam limited by obesity IMPRESSION: Gallbladder sludge and gallstones without evidence of acute cholecystitis. No biliary dilatation Hepatic steatosis Electronically Signed   By: Marlan Palau M.D.   On: 02/04/2018 07:53     Subjective: Feels well. Eating with no abdominal pain or vomiting. 3 BMs per day, improved.   Discharge Exam: Vitals:   02/08/18 0519 02/08/18 0831  BP: 127/74 120/84  Pulse: 80 90  Resp: 18 18  Temp: 98.1 F (36.7 C) 97.8 F (36.6 C)  SpO2: 100% 100%   General: Pt is alert, awake, not in acute distress Cardiovascular: RRR, S1/S2 +, no rubs, no gallops Respiratory: CTA bilaterally, no wheezing, no rhonchi Abdominal: Soft, NT, ND, bowel sounds + Extremities: No edema, no cyanosis  Labs: BNP (last 3 results) No results for input(s): BNP in the last 8760 hours. Basic Metabolic Panel: Recent Labs  Lab 02/03/18 1925 02/04/18 0520 02/05/18 0649 02/06/18 0342 02/07/18 0549 02/08/18 0543  NA 144 143 144 142 141 143  K 4.1 4.0 4.0 3.9 3.5 3.9  CL 114* 113* 112* 107 106 106  CO2 19* 20* 21* 24 25 26   GLUCOSE  125* 135* 119* 121* 132* 132*  BUN 14 13 8 7  5* 7  CREATININE 1.96* 1.93* 1.76* 1.55* 1.53* 1.49*  CALCIUM 7.7* 7.8* 7.8* 7.9* 7.9* 8.3*  MG 1.8  --   --   --   --   --    Liver Function Tests: Recent Labs  Lab 02/04/18 0520 02/05/18 0649 02/06/18 0342 02/07/18 0549 02/08/18 0543  AST 10* 10* 14* 15 16  ALT 10 8 11 10 10   ALKPHOS 128* 105 99 91 95  BILITOT 0.6 0.6 0.4 0.6 0.4  PROT 5.7* 7.4 5.6* 6.1* 6.3*  ALBUMIN 1.6* 1.6* 1.7* 1.8* 2.1*   Recent Labs  Lab 02/03/18 2133 02/05/18 0649 02/06/18 0342 02/07/18 0549 02/08/18 0543  LIPASE 147* 108* 115* 118* 110*   No results for input(s): AMMONIA in the last 168 hours. CBC: Recent Labs  Lab 02/03/18 1925 02/04/18 0520 02/05/18 0649 02/06/18 0342 02/07/18 0549 02/08/18 0543  WBC 16.6* 14.7* 12.6* 12.1* 9.6 9.7  NEUTROABS 14.8*  --   --   --   --   --   HGB 8.4* 7.5* 7.2* 7.8* 8.3* 8.7*  HCT 27.7* 24.2* 23.3* 25.0* 26.5* 28.1*  MCV 90.2 88.6 88.6 86.8 86.3 87.8  PLT 211 150 148* 173 192 216   Cardiac Enzymes: No results for input(s): CKTOTAL, CKMB, CKMBINDEX, TROPONINI in the last  168 hours. BNP: Invalid input(s): POCBNP CBG: No results for input(s): GLUCAP in the last 168 hours. D-Dimer No results for input(s): DDIMER in the last 72 hours. Hgb A1c No results for input(s): HGBA1C in the last 72 hours. Lipid Profile No results for input(s): CHOL, HDL, LDLCALC, TRIG, CHOLHDL, LDLDIRECT in the last 72 hours. Thyroid function studies No results for input(s): TSH, T4TOTAL, T3FREE, THYROIDAB in the last 72 hours.  Invalid input(s): FREET3 Anemia work up No results for input(s): VITAMINB12, FOLATE, FERRITIN, TIBC, IRON, RETICCTPCT in the last 72 hours. Urinalysis    Component Value Date/Time   COLORURINE YELLOW 02/04/2018 1351   APPEARANCEUR HAZY (A) 02/04/2018 1351   LABSPEC 1.010 02/04/2018 1351   PHURINE 6.0 02/04/2018 1351   GLUCOSEU NEGATIVE 02/04/2018 1351   HGBUR LARGE (A) 02/04/2018 1351    BILIRUBINUR NEGATIVE 02/04/2018 1351   KETONESUR 20 (A) 02/04/2018 1351   PROTEINUR 30 (A) 02/04/2018 1351   NITRITE NEGATIVE 02/04/2018 1351   LEUKOCYTESUR LARGE (A) 02/04/2018 1351    Microbiology No results found for this or any previous visit (from the past 240 hour(s)).  Time coordinating discharge: Approximately 40 minutes  Tyrone Nineyan B Nini Cavan, MD  [facility] 02/08/2018, 11:31 AM Pager [phone]

## 2018-03-09 ENCOUNTER — Encounter (HOSPITAL_COMMUNITY): Payer: Self-pay | Admitting: Emergency Medicine

## 2018-03-09 ENCOUNTER — Inpatient Hospital Stay (HOSPITAL_COMMUNITY)
Admission: EM | Admit: 2018-03-09 | Discharge: 2018-03-17 | DRG: 438 | Disposition: A | Discharge status: Home / Self Care | Payer: Medicaid Other | Attending: Family Medicine | Admitting: Family Medicine

## 2018-03-09 DIAGNOSIS — Z6839 Body mass index (BMI) 39.0-39.9, adult: Secondary | ICD-10-CM

## 2018-03-09 DIAGNOSIS — Z9049 Acquired absence of other specified parts of digestive tract: Secondary | ICD-10-CM

## 2018-03-09 DIAGNOSIS — N183 Chronic kidney disease, stage 3 unspecified: Secondary | ICD-10-CM

## 2018-03-09 DIAGNOSIS — Z9111 Patient's noncompliance with dietary regimen: Secondary | ICD-10-CM

## 2018-03-09 DIAGNOSIS — R112 Nausea with vomiting, unspecified: Secondary | ICD-10-CM | POA: Diagnosis present

## 2018-03-09 DIAGNOSIS — K8689 Other specified diseases of pancreas: Secondary | ICD-10-CM | POA: Diagnosis present

## 2018-03-09 DIAGNOSIS — N390 Urinary tract infection, site not specified: Secondary | ICD-10-CM

## 2018-03-09 DIAGNOSIS — R509 Fever, unspecified: Secondary | ICD-10-CM

## 2018-03-09 DIAGNOSIS — N179 Acute kidney failure, unspecified: Secondary | ICD-10-CM | POA: Diagnosis present

## 2018-03-09 DIAGNOSIS — K863 Pseudocyst of pancreas: Secondary | ICD-10-CM

## 2018-03-09 DIAGNOSIS — E43 Unspecified severe protein-calorie malnutrition: Secondary | ICD-10-CM

## 2018-03-09 DIAGNOSIS — E870 Hyperosmolality and hypernatremia: Secondary | ICD-10-CM | POA: Diagnosis present

## 2018-03-09 DIAGNOSIS — K760 Fatty (change of) liver, not elsewhere classified: Secondary | ICD-10-CM | POA: Diagnosis present

## 2018-03-09 DIAGNOSIS — E66813 Obesity, class 3: Secondary | ICD-10-CM | POA: Diagnosis present

## 2018-03-09 DIAGNOSIS — F4321 Adjustment disorder with depressed mood: Secondary | ICD-10-CM

## 2018-03-09 DIAGNOSIS — E860 Dehydration: Secondary | ICD-10-CM | POA: Diagnosis present

## 2018-03-09 DIAGNOSIS — K851 Biliary acute pancreatitis without necrosis or infection: Principal | ICD-10-CM | POA: Diagnosis present

## 2018-03-09 DIAGNOSIS — F419 Anxiety disorder, unspecified: Secondary | ICD-10-CM | POA: Diagnosis present

## 2018-03-09 DIAGNOSIS — E876 Hypokalemia: Secondary | ICD-10-CM | POA: Diagnosis present

## 2018-03-09 DIAGNOSIS — Z4659 Encounter for fitting and adjustment of other gastrointestinal appliance and device: Secondary | ICD-10-CM

## 2018-03-09 DIAGNOSIS — Z915 Personal history of self-harm: Secondary | ICD-10-CM

## 2018-03-09 HISTORY — DX: Morbid (severe) obesity with alveolar hypoventilation: E66.2

## 2018-03-09 HISTORY — DX: Chronic kidney disease, stage 3 (moderate): N18.3

## 2018-03-09 HISTORY — DX: Calculus of bile duct without cholangitis or cholecystitis without obstruction: K85.90

## 2018-03-09 HISTORY — DX: Calculus of bile duct without cholangitis or cholecystitis without obstruction: K80.50

## 2018-03-09 LAB — URINALYSIS, ROUTINE W REFLEX MICROSCOPIC
BILIRUBIN URINE: NEGATIVE
GLUCOSE, UA: NEGATIVE mg/dL
KETONES UR: NEGATIVE mg/dL
NITRITE: NEGATIVE
PH: 6 (ref 5.0–8.0)
Protein, ur: 100 mg/dL — AB
RBC / HPF: >50 RBC/hpf — ABNORMAL HIGH (ref 0–5)
Specific Gravity, Urine: 1.018 (ref 1.005–1.030)

## 2018-03-09 LAB — COMPREHENSIVE METABOLIC PANEL
ALBUMIN: 3.7 g/dL (ref 3.5–5.0)
ALT: 37 U/L (ref 0–44)
AST: 35 U/L (ref 15–41)
Alkaline Phosphatase: 118 U/L (ref 38–126)
Anion gap: 14 (ref 5–15)
BILIRUBIN TOTAL: 1.8 mg/dL — AB (ref 0.3–1.2)
BUN: 10 mg/dL (ref 6–20)
CHLORIDE: 104 mmol/L (ref 98–111)
CO2: 23 mmol/L (ref 22–32)
Calcium: 9.1 mg/dL (ref 8.9–10.3)
Creatinine, Ser: 1.9 mg/dL — ABNORMAL HIGH (ref 0.44–1.00)
GFR calc Af Amer: 41 mL/min — ABNORMAL LOW (ref >60)
GFR calc non Af Amer: 36 mL/min — ABNORMAL LOW (ref >60)
GLUCOSE: 145 mg/dL — AB (ref 70–99)
POTASSIUM: 3.3 mmol/L — AB (ref 3.5–5.1)
Sodium: 141 mmol/L (ref 135–145)
TOTAL PROTEIN: 8.1 g/dL (ref 6.5–8.1)

## 2018-03-09 LAB — CBC
HCT: 37.9 % (ref 36.0–46.0)
Hemoglobin: 12 g/dL (ref 12.0–15.0)
MCH: 27.3 pg (ref 26.0–34.0)
MCHC: 31.7 g/dL (ref 30.0–36.0)
MCV: 86.1 fL (ref 78.0–100.0)
Platelets: 229 10*3/uL (ref 150–400)
RBC: 4.4 MIL/uL (ref 3.87–5.11)
RDW: 16.2 % — ABNORMAL HIGH (ref 11.5–15.5)
WBC: 10.3 10*3/uL (ref 4.0–10.5)

## 2018-03-09 LAB — LIPASE, BLOOD: Lipase: 39 U/L (ref 11–51)

## 2018-03-09 LAB — I-STAT BETA HCG BLOOD, ED (MC, WL, AP ONLY): I-stat hCG, quantitative: <5 m[IU]/mL (ref <5)

## 2018-03-09 NOTE — ED Triage Notes (Signed)
Patient reports persistent emesis for several days unrelieved by prescription Zofran , denies diarrhea or fever , s/p cholecystectomy 2 weeks ago .

## 2018-03-10 ENCOUNTER — Inpatient Hospital Stay (HOSPITAL_COMMUNITY): Payer: Medicaid Other

## 2018-03-10 ENCOUNTER — Other Ambulatory Visit: Payer: Self-pay

## 2018-03-10 ENCOUNTER — Encounter (HOSPITAL_COMMUNITY): Payer: Self-pay | Admitting: Internal Medicine

## 2018-03-10 ENCOUNTER — Emergency Department (HOSPITAL_COMMUNITY): Payer: Medicaid Other

## 2018-03-10 DIAGNOSIS — K8689 Other specified diseases of pancreas: Secondary | ICD-10-CM | POA: Diagnosis present

## 2018-03-10 DIAGNOSIS — R112 Nausea with vomiting, unspecified: Secondary | ICD-10-CM

## 2018-03-10 DIAGNOSIS — Z9111 Patient's noncompliance with dietary regimen: Secondary | ICD-10-CM | POA: Diagnosis not present

## 2018-03-10 DIAGNOSIS — F419 Anxiety disorder, unspecified: Secondary | ICD-10-CM | POA: Diagnosis present

## 2018-03-10 DIAGNOSIS — Z6839 Body mass index (BMI) 39.0-39.9, adult: Secondary | ICD-10-CM | POA: Diagnosis not present

## 2018-03-10 DIAGNOSIS — E860 Dehydration: Secondary | ICD-10-CM | POA: Diagnosis present

## 2018-03-10 DIAGNOSIS — N183 Chronic kidney disease, stage 3 unspecified: Secondary | ICD-10-CM

## 2018-03-10 DIAGNOSIS — K760 Fatty (change of) liver, not elsewhere classified: Secondary | ICD-10-CM | POA: Diagnosis present

## 2018-03-10 DIAGNOSIS — E662 Morbid (severe) obesity with alveolar hypoventilation: Secondary | ICD-10-CM

## 2018-03-10 DIAGNOSIS — Z9049 Acquired absence of other specified parts of digestive tract: Secondary | ICD-10-CM | POA: Diagnosis not present

## 2018-03-10 DIAGNOSIS — E66813 Obesity, class 3: Secondary | ICD-10-CM | POA: Diagnosis present

## 2018-03-10 DIAGNOSIS — N179 Acute kidney failure, unspecified: Secondary | ICD-10-CM | POA: Diagnosis present

## 2018-03-10 DIAGNOSIS — K851 Biliary acute pancreatitis without necrosis or infection: Secondary | ICD-10-CM | POA: Diagnosis present

## 2018-03-10 DIAGNOSIS — E43 Unspecified severe protein-calorie malnutrition: Secondary | ICD-10-CM | POA: Diagnosis present

## 2018-03-10 DIAGNOSIS — Z915 Personal history of self-harm: Secondary | ICD-10-CM | POA: Diagnosis not present

## 2018-03-10 DIAGNOSIS — K863 Pseudocyst of pancreas: Secondary | ICD-10-CM | POA: Diagnosis present

## 2018-03-10 DIAGNOSIS — E870 Hyperosmolality and hypernatremia: Secondary | ICD-10-CM | POA: Diagnosis present

## 2018-03-10 DIAGNOSIS — F4321 Adjustment disorder with depressed mood: Secondary | ICD-10-CM | POA: Diagnosis present

## 2018-03-10 DIAGNOSIS — E876 Hypokalemia: Secondary | ICD-10-CM | POA: Diagnosis present

## 2018-03-10 HISTORY — DX: Chronic kidney disease, stage 3 unspecified: N18.30

## 2018-03-10 HISTORY — DX: Morbid (severe) obesity with alveolar hypoventilation: E66.2

## 2018-03-10 MED ORDER — SODIUM CHLORIDE 0.9 % IV SOLN
1.0000 g | Freq: Three times a day (TID) | INTRAVENOUS | Status: DC
  Administered 2018-03-10: 1 g via INTRAVENOUS
  Filled 2018-03-10 (×2): qty 1

## 2018-03-10 MED ORDER — SODIUM CHLORIDE 0.9 % IV SOLN
INTRAVENOUS | Status: DC
  Administered 2018-03-10 – 2018-03-11 (×2): via INTRAVENOUS

## 2018-03-10 MED ORDER — ORAL CARE MOUTH RINSE
15.0000 mL | Freq: Two times a day (BID) | OROMUCOSAL | Status: DC
  Administered 2018-03-10 – 2018-03-16 (×10): 15 mL via OROMUCOSAL

## 2018-03-10 MED ORDER — ENOXAPARIN SODIUM 40 MG/0.4ML ~~LOC~~ SOLN
40.0000 mg | SUBCUTANEOUS | Status: DC
  Administered 2018-03-10 – 2018-03-17 (×8): 40 mg via SUBCUTANEOUS
  Filled 2018-03-10 (×8): qty 0.4

## 2018-03-10 MED ORDER — ONDANSETRON HCL 4 MG/2ML IJ SOLN
4.0000 mg | Freq: Once | INTRAMUSCULAR | Status: AC
Start: 2018-03-10 — End: 2018-03-10
  Administered 2018-03-10: 4 mg via INTRAVENOUS
  Filled 2018-03-10: qty 2

## 2018-03-10 MED ORDER — IOPAMIDOL (ISOVUE-300) INJECTION 61%
100.0000 mL | Freq: Once | INTRAVENOUS | Status: AC
  Administered 2018-03-10: 80 mL via INTRAVENOUS

## 2018-03-10 MED ORDER — POLYETHYLENE GLYCOL 3350 17 G PO PACK: 17.0000 g | PACK | Freq: Every day | ORAL | Status: DC | PRN

## 2018-03-10 MED ORDER — PROMETHAZINE HCL 25 MG/ML IJ SOLN
12.5000 mg | Freq: Four times a day (QID) | INTRAMUSCULAR | Status: DC | PRN
  Administered 2018-03-11 – 2018-03-14 (×6): 12.5 mg via INTRAVENOUS
  Filled 2018-03-10 (×6): qty 1

## 2018-03-10 MED ORDER — PANCRELIPASE (LIP-PROT-AMYL) 36000-114000 UNITS PO CPEP
36000.0000 [IU] | ORAL_CAPSULE | Freq: Three times a day (TID) | ORAL | Status: DC
Start: 2018-03-10 — End: 2018-03-13
  Administered 2018-03-10 (×2): 36000 [IU] via ORAL
  Filled 2018-03-10 (×10): qty 1

## 2018-03-10 MED ORDER — ONDANSETRON HCL 4 MG PO TABS: 4.0000 mg | ORAL_TABLET | Freq: Four times a day (QID) | ORAL | Status: DC | PRN

## 2018-03-10 MED ORDER — GADOBENATE DIMEGLUMINE 529 MG/ML IV SOLN
10.0000 mL | Freq: Once | INTRAVENOUS | Status: AC | PRN
  Administered 2018-03-10: 10 mL via INTRAVENOUS

## 2018-03-10 MED ORDER — TRAMADOL HCL 50 MG PO TABS: 50.0000 mg | ORAL_TABLET | Freq: Four times a day (QID) | ORAL | Status: DC | PRN

## 2018-03-10 MED ORDER — POTASSIUM CHLORIDE CRYS ER 20 MEQ PO TBCR
40.0000 meq | EXTENDED_RELEASE_TABLET | Freq: Two times a day (BID) | ORAL | Status: AC
  Administered 2018-03-10 (×2): 40 meq via ORAL
  Filled 2018-03-10 (×2): qty 2

## 2018-03-10 MED ORDER — SODIUM CHLORIDE 0.9 % IV BOLUS
1000.0000 mL | Freq: Once | INTRAVENOUS | Status: AC
  Administered 2018-03-10: 1000 mL via INTRAVENOUS

## 2018-03-10 MED ORDER — ACETAMINOPHEN 325 MG PO TABS
650.0000 mg | ORAL_TABLET | Freq: Four times a day (QID) | ORAL | Status: DC | PRN
  Administered 2018-03-15 (×2): 650 mg via ORAL
  Filled 2018-03-10 (×2): qty 2

## 2018-03-10 MED ORDER — ONDANSETRON HCL 4 MG/2ML IJ SOLN
4.0000 mg | Freq: Four times a day (QID) | INTRAMUSCULAR | Status: DC | PRN
  Administered 2018-03-10 – 2018-03-11 (×3): 4 mg via INTRAVENOUS
  Filled 2018-03-10 (×3): qty 2

## 2018-03-10 MED ORDER — ENSURE ENLIVE PO LIQD: 237.0000 mL | Freq: Two times a day (BID) | ORAL | Status: DC

## 2018-03-10 MED ORDER — IOPAMIDOL (ISOVUE-300) INJECTION 61%
INTRAVENOUS | Status: AC
  Filled 2018-03-10: qty 100

## 2018-03-10 MED ORDER — ACETAMINOPHEN 650 MG RE SUPP: 650.0000 mg | Freq: Four times a day (QID) | RECTAL | Status: DC | PRN

## 2018-03-10 NOTE — ED Notes (Signed)
Pt unable to void at this time. 

## 2018-03-10 NOTE — H&P (Signed)
History and Physical    Sharon Duncan ZOX:[id number] DOB: 1990/09/18 DOA: 03/09/2018  PCP: System, Pcp Not In   Patient coming from: home   Chief Complaint: N/V  HPI: Sharon Duncan is a 27 y.o. female with medical history significant of acute pancreatitis complicated by area of walled off necrosis, recalcitrant nausea and vomiting who presents again with nausea and vomiting.  Patient reports that approximately 4 months ago while she was still pregnant she began to have episodes of nonbilious nonbloody emesis.  This was initially attributed to her pregnancy but after delivery on 01/26/2018 via unconjugated vaginal delivery she again developed nausea, vomiting.  She was evaluated [facility] emergency department where a large pancreatic pseudocyst or phlegmon was noted.  Patient was transferred to this hospital on 02/03/2018 and was evaluated by GI.  Patient's diet was slowly advanced and she was tolerating this well.  The plan was to have a laparoscopic cholecystectomy with intraoperative cholangiogram as an outpatient.  She was discharged with pancreatic enzymes.  She reports that she did have a cholecystectomy at [facility] by Dr. Olegario Messier however she is not sure about the intraoperative cholangiogram.  She reports that despite that surgery she continues to have episodes of nonbilious nonbloody emesis.  She reports that she could really only tolerate small bites of macaroni and cheese, soda, hot dogs.  She reports having lost 70 pounds since her pregnancy approximately 1 month ago.  She reports that the pancreatic enzymes helped somewhat but she has since run out and she stopped taking them as she felt that after the cholecystectomy she would be better.  She denies any fevers, abdominal pain, diarrhea, cough, congestion, rhinorrhea, chest pain.  She does report some heartburn symptoms.  ED Course: In the ED patient's vitals were stable.  Her labs are notable for mildly elevated T bili at 1.8.  Her  lipase was normal.  A CT abdomen pelvis showed a circumcised collection arising from the body and tail of the pancreas of walled off necrosis or possible abscess.  Patient was also noted to have postoperative changes at the gallbladder fossa with fluid and gas that were felt to be postoperative but could also be an abscess or leak.  Review of Systems: As per HPI otherwise 10 point review of systems negative.    Past Medical History:  Diagnosis Date  . CKD (chronic kidney disease), stage III (HCC) 03/10/2018  . Depression   . Frequent UTI   . Obesity hypoventilation syndrome (HCC) 03/10/2018  . Pancreatitis due to common bile duct stone     Past Surgical History:  Procedure Laterality Date  . CHOLECYSTECTOMY    . KIDNEY SURGERY  2004   ?to lift kidney? d/t frequent UTIs  . TOOTH EXTRACTION N/A 03/03/2014   Procedure: EXTRACTION OF WISDOM TEETH;  Surgeon: Georgia Lopes, DDS;  Location: [facility] OR;  Service: Oral Surgery;  Laterality: N/A;     reports that she has never smoked. She has never used smokeless tobacco. She reports that she drinks alcohol. She reports that she does not use drugs.  Allergies  Allergen Reactions  . Rocephin [Ceftriaxone] Rash    Family History  Problem Relation Age of Onset  . Other Neg Hx      Prior to Admission medications   Medication Sig Start Date End Date Taking? Authorizing Provider  dimenhyDRINATE (DRAMAMINE) 50 MG tablet Take 50 mg by mouth every 6 (six) hours as needed for nausea.   Yes [provider]  lipase/protease/amylase (CREON) 36000 UNITS CPEP capsule Take 1 capsule (36,000 Units total) by mouth 3 (three) times daily with meals. 02/08/18  Yes Tyrone Nine, MD  ondansetron (ZOFRAN) 4 MG tablet Take 4 mg by mouth every 6 (six) hours as needed for nausea/vomiting. 03/05/18  Yes [provider]    Physical Exam: Vitals:   03/10/18 0645 03/10/18 0700 03/10/18 0715 03/10/18 0730  BP: 130/82 123/76 129/84 (!) 124/91  Pulse:  71 72 85 68  Resp:    18  Temp:      TempSrc:      SpO2: 100% 100% 100% 99%  Weight:      Height:        Constitutional: NAD, calm, comfortable Vitals:   03/10/18 0645 03/10/18 0700 03/10/18 0715 03/10/18 0730  BP: 130/82 123/76 129/84 (!) 124/91  Pulse: 71 72 85 68  Resp:    18  Temp:      TempSrc:      SpO2: 100% 100% 100% 99%  Weight:      Height:       Eyes: anIcteric sclera ENMT: Moist mucous membranes membranes Neck: normal, supple, Respiratory: clear to auscultation bilaterally, no wheezing, no crackles. Normal respiratory effort. No accessory muscle use.  Cardiovascular: Regular rate and rhythm, no murmurs Abdomen: no tenderness, no masses palpated. No hepatosplenomegaly. Bowel sounds positive.  Musculoskeletal: 1+ lower extremity edema Skin: Laparoscopic cholecystectomy sites are clean dry and intact Neurologic: Grossly intact, moving all extremities.  Psychiatric: Normal judgment and insight. Alert and oriented x 3. Normal mood.     Labs on Admission: I have personally reviewed following labs and imaging studies  CBC: Recent Labs  Lab 03/09/18 2220  WBC 10.3  HGB 12.0  HCT 37.9  MCV 86.1  PLT 229   Basic Metabolic Panel: Recent Labs  Lab 03/09/18 2220  NA 141  K 3.3*  CL 104  CO2 23  GLUCOSE 145*  BUN 10  CREATININE 1.90*  CALCIUM 9.1   GFR: Estimated Creatinine Clearance: 53.2 mL/min (A) (by C-G formula based on SCr of 1.9 mg/dL (H)). Liver Function Tests: Recent Labs  Lab 03/09/18 2220  AST 35  ALT 37  ALKPHOS 118  BILITOT 1.8*  PROT 8.1  ALBUMIN 3.7   Recent Labs  Lab 03/09/18 2220  LIPASE 39   No results for input(s): AMMONIA in the last 168 hours. Coagulation Profile: No results for input(s): INR, PROTIME in the last 168 hours. Cardiac Enzymes: No results for input(s): CKTOTAL, CKMB, CKMBINDEX, TROPONINI in the last 168 hours. BNP (last 3 results) No results for input(s): PROBNP in the last 8760 hours. HbA1C: No  results for input(s): HGBA1C in the last 72 hours. CBG: No results for input(s): GLUCAP in the last 168 hours. Lipid Profile: No results for input(s): CHOL, HDL, LDLCALC, TRIG, CHOLHDL, LDLDIRECT in the last 72 hours. Thyroid Function Tests: No results for input(s): TSH, T4TOTAL, FREET4, T3FREE, THYROIDAB in the last 72 hours. Anemia Panel: No results for input(s): VITAMINB12, FOLATE, FERRITIN, TIBC, IRON, RETICCTPCT in the last 72 hours. Urine analysis:    Component Value Date/Time   COLORURINE AMBER (A) 03/09/2018 2034   APPEARANCEUR CLOUDY (A) 03/09/2018 2034   LABSPEC 1.018 03/09/2018 2034   PHURINE 6.0 03/09/2018 2034   GLUCOSEU NEGATIVE 03/09/2018 2034   HGBUR MODERATE (A) 03/09/2018 2034   BILIRUBINUR NEGATIVE 03/09/2018 2034   KETONESUR NEGATIVE 03/09/2018 2034   PROTEINUR 100 (A) 03/09/2018 2034   NITRITE NEGATIVE 03/09/2018 2034  LEUKOCYTESUR LARGE (A) 03/09/2018 2034    Radiological Exams on Admission: Ct Abdomen Pelvis W Contrast  Result Date: 03/10/2018 CLINICAL DATA:  Two weeks post cholecystectomy. Nausea and vomiting for 3 days. Generalized abdominal pain and fever. EXAM: CT ABDOMEN AND PELVIS WITH CONTRAST TECHNIQUE: Multidetector CT imaging of the abdomen and pelvis was performed using the standard protocol following bolus administration of intravenous contrast. CONTRAST:  80mL ISOVUE-300 IOPAMIDOL (ISOVUE-300) INJECTION 61% COMPARISON:  None. FINDINGS: Lower chest: Lung bases are clear. Hepatobiliary: Mild diffuse fatty infiltration of the liver. No focal liver lesions. Surgical absence of the gallbladder. Fluid and a small amount of gas in the gallbladder fossa measuring 1.8 x 3.2 cm. This is nonspecific and could represent postoperative collection, abscess, or leak. Pancreas: Circumscribed collection arising from the body and tail of the pancreas and extending underneath the lesser curvature of the stomach measuring 3.7 x 5.2 cm in diameter. This most likely  represents an area of walled off necrosis associated with acute pancreatitis. Abscess would be another consideration, less likely. There is infiltration and edema around the tail of the pancreas and along the left anterior pararenal space. Spleen: Normal in size without focal abnormality. Adrenals/Urinary Tract: No adrenal gland nodules. Bilateral renal parenchymal scarring and atrophy, greater on the left. No hydronephrosis or hydroureter. Nephrograms are symmetrical. Bladder is mostly decompressed. Stomach/Bowel: Stomach, small bowel, and colon are mostly decompressed. No wall thickening is appreciated although evaluation of wall is limited due to under distention. No inflammatory infiltrations. Appendix is normal. Vascular/Lymphatic: No significant vascular findings are present. No enlarged abdominal or pelvic lymph nodes. Reproductive: Uterus and bilateral adnexa are unremarkable. Other: No free air in the abdomen. Abdominal wall musculature appears intact. Musculoskeletal: No acute or significant osseous findings. IMPRESSION: 1. Fluid and gas in the gallbladder fossa with small amount of gas in the gallbladder fossa. This is probably a postoperative collection, but could represent abscess or leak. Surgical absence of the gallbladder. 2. Circumscribed collection arising from the body and tail of the pancreas. This most likely represents an area of walled off necrosis associated with acute pancreatitis. Abscess would be another consideration. 3. Mild diffuse fatty infiltration of the liver. Electronically Signed   By: Burman NievesWilliam  Stevens M.D.   On: 03/10/2018 04:56    EKG: Independently reviewed.  None performed  Assessment/Plan Active Problems:   Nausea & vomiting   Obesity, Class III, BMI 40-49.9 (morbid obesity) (HCC)   CKD (chronic kidney disease), stage III (HCC)   #) Recalcitrant nausea and vomiting: On review of the chart she has lost significant amounts of weight since her hospitalization  approximately 1 month ago.  Additionally while the CT scan does not reveal anything alarming in her letter lites are fairly reassuring it is fairly clear that she continues to have very poor p.o. intake.  At this time she will need interrogation of her biliary tract likely with an MRCP as well as evaluation by GI.  It is unclear if this is simply pancreatic insufficiency and she is stopped taking her Creon.  Additionally while she does have a postoperative fluid collection in the gallbladder she has no tenderness, leukocytosis, white feet fever.  It is unclear if this walled off collection noted in the pancreas is additionally causing any symptoms as it is not compressing anything.  Would consider TSH and evaluation of a.m. cortisol as these have not been completed yet.  She has no evidence of an intracranial mass. -GI consult -MRCP ordered - Clear liquid  diet -IV antiemetics -Continue pancreatic enzymes  #) Pancreatic fluid collection: At this time she does not have a white count or any fevers.  Apparently she was on a carb dependent letter prior hospitalization as her white count was elevated however it was not felt that this was an abscess. - Monitor  #) Stage III CKD/frequent UTIs: This is been relatively stable.  It is unclear the etiology of her CKD.  She does have frequent UTIs and her imaging does show evidence of renal parenchymal scarring suggesting this is the most likely etiology. -Avoid nephrotoxins -Follow-up UA/urine culture   Fluids: Gentle IV fluids Elect lites: Monitor and supplement Nutrition: Clear liquid diet  Prophylaxis: Enoxaparin  Disposition: Pending further interrogation of her biliary tree discussion with GI  Full code   Delaine LameShrey C Aedin Jeansonne MD [facility]  If 7PM-7AM, please contact night-coverage [email or web address] Password [email or web address]  03/10/2018, 8:06 AM

## 2018-03-10 NOTE — Consult Note (Signed)
Subjective:   HPI  The patient is a 27 year old female who had a baby in the recent past. She delivered on July 5. She states that during the third trimester of her pregnancy she started having nausea and vomiting. She states the doctors felt it was due to her pregnancy. After delivery she continued to experience nausea and vomiting. She was then found to have gallstones and pancreatitis. She then underwent a cholecystectomy. She has continued to have problems with nausea and vomiting and not being able to keep much down. She was seen in the hospital here by Dr. Ewing SchleinMagod on her last visit. This admission her lipase is normal. A repeat CT scan done yesterday shows fluid and gas in the gallbladder fossa probably postoperative but could represent abscess or leak. There was also a circumscribed collection arising from the body and tail of the pancreas most likely representing an area of wall off necrosis associated with acute pancreatitis, abscess would be another consideration. Mild diffuse fatty liver. The patient does not have an elevated white count and her lipase is normal. She denies abdominal pain. No fever.  Review of Systems No chest pain or shortness of breath  Past Medical History:  Diagnosis Date  . CKD (chronic kidney disease), stage III (HCC) 03/10/2018  . Depression   . Frequent UTI   . Obesity hypoventilation syndrome (HCC) 03/10/2018  . Pancreatitis due to common bile duct stone    Past Surgical History:  Procedure Laterality Date  . CHOLECYSTECTOMY    . KIDNEY SURGERY  2004   ?to lift kidney? d/t frequent UTIs  . TOOTH EXTRACTION N/A 03/03/2014   Procedure: EXTRACTION OF WISDOM TEETH;  Surgeon: Georgia LopesScott M Jensen, DDS;  [facility];  Service: Oral Surgery;  Laterality: N/A;   Social History   Socioeconomic History  . Marital status: Single    Spouse name: Not on file  . Number of children: 4  . Years of education: some college  . Highest education level: Some college, no degree   Occupational History  . Occupation: stay at home mom  Social Needs  . Financial resource strain: Somewhat hard  . Food insecurity:    Worry: Never true    Inability: Never true  . Transportation needs:    Medical: No    Non-medical: No  Tobacco Use  . Smoking status: Never Smoker  . Smokeless tobacco: Never Used  Substance and Sexual Activity  . Alcohol use: Yes    Comment: Rare  . Drug use: No  . Sexual activity: Yes    Birth control/protection: Condom  Lifestyle  . Physical activity:    Days per week: 1 day    Minutes per session: 30 min  . Stress: Not at all  Relationships  . Social connections:    Talks on phone: Once a week    Gets together: Once a week    Attends religious service: More than 4 times per year    Active member of club or organization: No    Attends meetings of clubs or organizations: Never    Relationship status: Living with partner  . Intimate partner violence:    Fear of current or ex partner: No    Emotionally abused: No    Physically abused: No    Forced sexual activity: No  Other Topics Concern  . Not on file  Social History Narrative  . Not on file   family history is not on file.  Current Facility-Administered Medications:  .  0.9 %  sodium chloride infusion, , Intravenous, Continuous, Purohit, Shrey C, MD, Last Rate: 125 mL/hr at 03/10/18 0902 .  acetaminophen (TYLENOL) tablet 650 mg, 650 mg, Oral, Q6H PRN **OR** acetaminophen (TYLENOL) suppository 650 mg, 650 mg, Rectal, Q6H PRN, Purohit, Shrey C, MD .  enoxaparin (LOVENOX) injection 40 mg, 40 mg, Subcutaneous, Q24H, Purohit, Shrey C, MD .  feeding supplement (ENSURE ENLIVE) (ENSURE ENLIVE) liquid 237 mL, 237 mL, Oral, BID BM, Eduard ClosKakrakandy, Arshad N, MD .  iopamidol (ISOVUE-300) 61 % injection, , , ,  .  lipase/protease/amylase (CREON) capsule 36,000 Units, 36,000 Units, Oral, TID WC, Purohit, Shrey C, MD .  MEDLINE mouth rinse, 15 mL, Mouth Rinse, BID, Eduard ClosKakrakandy, Arshad N, MD .   ondansetron (ZOFRAN) tablet 4 mg, 4 mg, Oral, Q6H PRN **OR** ondansetron (ZOFRAN) injection 4 mg, 4 mg, Intravenous, Q6H PRN, Purohit, Shrey C, MD, 4 mg at 03/10/18 1025 .  polyethylene glycol (MIRALAX / GLYCOLAX) packet 17 g, 17 g, Oral, Daily PRN, Purohit, Shrey C, MD .  potassium chloride SA (K-DUR,KLOR-CON) CR tablet 40 mEq, 40 mEq, Oral, BID, Purohit, Shrey C, MD .  promethazine (PHENERGAN) injection 12.5 mg, 12.5 mg, Intravenous, Q6H PRN, Purohit, Shrey C, MD .  traMADol (ULTRAM) tablet 50 mg, 50 mg, Oral, Q6H PRN, Purohit, Salli QuarryShrey C, MD Allergies  Allergen Reactions  . Rocephin [Ceftriaxone] Rash     Objective:     BP (!) 136/95 (BP Location: Right Leg)   Pulse 85   Temp 98.2 F (36.8 C) (Oral)   Resp 18   Ht 5\' 4"  (1.626 m)   Wt 104.3 kg   LMP 03/08/2018   SpO2 99%   Breastfeeding? Unknown   BMI 39.48 kg/m   No acute distress  Heart regular rhythm no murmurs  Lungs clear  Abdomen bowel sounds present, soft, nontender  Laboratory No components found for: D1    Assessment:     Ongoing nausea and vomiting  Gallstone pancreatitis with abnormal findings on CT as mentioned.      Plan:     Continue supportive care. Obtain MRCP to see if there may be a CBD stone. Without abdominal pain fever or elevated white count I don't think these fluid collections are abscesses. We will follow clinically.

## 2018-03-10 NOTE — ED Provider Notes (Signed)
[facility] EMERGENCY DEPARTMENT Provider Note   CSN: [id number] Arrival date & time: 03/09/18  2125     History   Chief Complaint Chief Complaint  Patient presents with  . Emesis    HPI Sharon Duncan is a 27 y.o. female.  Patient presents for evaluation of uncontrollable nausea and vomiting.  Patient recently treated for gallstone pancreatitis last month.  She had her gallbladder removed at [facility] 2 weeks ago.  Since the surgery she has had persistent nausea and vomiting.  She was seen by her surgeon 2 days ago, she has been taking Zofran without improvement.  Patient has not been able to eat or drink anything, cannot hold anything down.  She is not expensing abdominal pain.  She has not had any fever.  Significant other reports that she has lost 40 pounds in the last 2 weeks.     Past Medical History:  Diagnosis Date  . Depression   . Frequent UTI     Patient Active Problem List   Diagnosis Date Noted  . Acute recurrent pancreatitis 02/03/2018    Past Surgical History:  Procedure Laterality Date  . CHOLECYSTECTOMY    . KIDNEY SURGERY  2004   ?to lift kidney? d/t frequent UTIs  . TOOTH EXTRACTION N/A 03/03/2014   Procedure: EXTRACTION OF WISDOM TEETH;  Surgeon: Georgia Lopes, DDS;  Location: [facility] OR;  Service: Oral Surgery;  Laterality: N/A;     OB History    Gravida  5   Para  3   Term      Preterm      AB  1   Living        SAB  1   TAB      Ectopic      Multiple      Live Births               Home Medications    Prior to Admission medications   Medication Sig Start Date End Date Taking? Authorizing Provider  dimenhyDRINATE (DRAMAMINE) 50 MG tablet Take 50 mg by mouth every 6 (six) hours as needed for nausea.   Yes [provider]  lipase/protease/amylase (CREON) 36000 UNITS CPEP capsule Take 1 capsule (36,000 Units total) by mouth 3 (three) times daily with meals. 02/08/18  Yes Tyrone Nine, MD    ondansetron (ZOFRAN) 4 MG tablet Take 4 mg by mouth every 6 (six) hours as needed for nausea/vomiting. 03/05/18  Yes [provider]    Family History No family history on file.  Social History Social History   Tobacco Use  . Smoking status: Never Smoker  . Smokeless tobacco: Never Used  Substance Use Topics  . Alcohol use: Yes    Comment: occ  . Drug use: No     Allergies   Rocephin [ceftriaxone]   Review of Systems Review of Systems  Gastrointestinal: Positive for nausea and vomiting.  All other systems reviewed and are negative.    Physical Exam Updated Vital Signs BP 117/78   Pulse 75   Temp 98.4 F (36.9 C) (Oral)   Resp 16   Ht 5\' 4"  (1.626 m)   Wt 104.3 kg   LMP 03/08/2018   SpO2 98%   Breastfeeding? Unknown   BMI 39.48 kg/m   Physical Exam  Constitutional: She is oriented to person, place, and time. She appears well-developed and well-nourished. No distress.  HENT:  Head: Normocephalic and atraumatic.  Right Ear: Hearing  normal.  Left Ear: Hearing normal.  Nose: Nose normal.  Mouth/Throat: Oropharynx is clear and moist and mucous membranes are normal.  Eyes: Pupils are equal, round, and reactive to light. Conjunctivae and EOM are normal.  Neck: Normal range of motion. Neck supple.  Cardiovascular: Regular rhythm, S1 normal and S2 normal. Tachycardia present. Exam reveals no gallop and no friction rub.  No murmur heard. Pulmonary/Chest: Effort normal and breath sounds normal. No respiratory distress. She exhibits no tenderness.  Abdominal: Soft. Normal appearance and bowel sounds are normal. There is no hepatosplenomegaly. There is no tenderness. There is no rebound, no guarding, no tenderness at McBurney's point and negative Murphy's sign. No hernia.  Musculoskeletal: Normal range of motion.  Neurological: She is alert and oriented to person, place, and time. She has normal strength. No cranial nerve deficit or sensory deficit.  Coordination normal. GCS eye subscore is 4. GCS verbal subscore is 5. GCS motor subscore is 6.  Skin: Skin is warm, dry and intact. No rash noted. No cyanosis.  Psychiatric: She has a normal mood and affect. Her speech is normal and behavior is normal. Thought content normal.  Nursing note and vitals reviewed.    ED Treatments / Results  Labs (all labs ordered are listed, but only abnormal results are displayed) Labs Reviewed  COMPREHENSIVE METABOLIC PANEL - Abnormal; Notable for the following components:      Result Value   Potassium 3.3 (*)    Glucose, Bld 145 (*)    Creatinine, Ser 1.90 (*)    Total Bilirubin 1.8 (*)    GFR calc non Af Amer 36 (*)    GFR calc Af Amer 41 (*)    All other components within normal limits  CBC - Abnormal; Notable for the following components:   RDW 16.2 (*)    All other components within normal limits  URINALYSIS, ROUTINE W REFLEX MICROSCOPIC - Abnormal; Notable for the following components:   Color, Urine AMBER (*)    APPearance CLOUDY (*)    Hgb urine dipstick MODERATE (*)    Protein, ur 100 (*)    Leukocytes, UA LARGE (*)    RBC / HPF >50 (*)    WBC, UA >50 (*)    Bacteria, UA MANY (*)    Non Squamous Epithelial 0-5 (*)    All other components within normal limits  URINE CULTURE  LIPASE, BLOOD  I-STAT BETA HCG BLOOD, ED ([facility], [facility], [facility] ONLY)    EKG None  Radiology Ct Abdomen Pelvis W Contrast  Result Date: 03/10/2018 CLINICAL DATA:  Two weeks post cholecystectomy. Nausea and vomiting for 3 days. Generalized abdominal pain and fever. EXAM: CT ABDOMEN AND PELVIS WITH CONTRAST TECHNIQUE: Multidetector CT imaging of the abdomen and pelvis was performed using the standard protocol following bolus administration of intravenous contrast. CONTRAST:  80mL ISOVUE-300 IOPAMIDOL (ISOVUE-300) INJECTION 61% COMPARISON:  None. FINDINGS: Lower chest: Lung bases are clear. Hepatobiliary: Mild diffuse fatty infiltration of the liver. No focal liver lesions.  Surgical absence of the gallbladder. Fluid and a small amount of gas in the gallbladder fossa measuring 1.8 x 3.2 cm. This is nonspecific and could represent postoperative collection, abscess, or leak. Pancreas: Circumscribed collection arising from the body and tail of the pancreas and extending underneath the lesser curvature of the stomach measuring 3.7 x 5.2 cm in diameter. This most likely represents an area of walled off necrosis associated with acute pancreatitis. Abscess would be another consideration, less likely. There is infiltration and  edema around the tail of the pancreas and along the left anterior pararenal space. Spleen: Normal in size without focal abnormality. Adrenals/Urinary Tract: No adrenal gland nodules. Bilateral renal parenchymal scarring and atrophy, greater on the left. No hydronephrosis or hydroureter. Nephrograms are symmetrical. Bladder is mostly decompressed. Stomach/Bowel: Stomach, small bowel, and colon are mostly decompressed. No wall thickening is appreciated although evaluation of wall is limited due to under distention. No inflammatory infiltrations. Appendix is normal. Vascular/Lymphatic: No significant vascular findings are present. No enlarged abdominal or pelvic lymph nodes. Reproductive: Uterus and bilateral adnexa are unremarkable. Other: No free air in the abdomen. Abdominal wall musculature appears intact. Musculoskeletal: No acute or significant osseous findings. IMPRESSION: 1. Fluid and gas in the gallbladder fossa with small amount of gas in the gallbladder fossa. This is probably a postoperative collection, but could represent abscess or leak. Surgical absence of the gallbladder. 2. Circumscribed collection arising from the body and tail of the pancreas. This most likely represents an area of walled off necrosis associated with acute pancreatitis. Abscess would be another consideration. 3. Mild diffuse fatty infiltration of the liver. Electronically Signed   By:  Burman NievesWilliam  Stevens M.D.   On: 03/10/2018 04:56    Procedures Procedures (including critical care time)  Medications Ordered in ED Medications  iopamidol (ISOVUE-300) 61 % injection (has no administration in time range)  sodium chloride 0.9 % bolus 1,000 mL (1,000 mLs Intravenous New Bag/Given 03/10/18 0555)  meropenem (MERREM) 1 g in sodium chloride 0.9 % 100 mL IVPB (has no administration in time range)  sodium chloride 0.9 % bolus 1,000 mL (has no administration in time range)  iopamidol (ISOVUE-300) 61 % injection 100 mL (80 mLs Intravenous Contrast Given 03/10/18 0411)  ondansetron (ZOFRAN) injection 4 mg (4 mg Intravenous Given 03/10/18 0555)     Initial Impression / Assessment and Plan / ED Course  I have reviewed the triage vital signs and the nursing notes.  Pertinent labs & imaging results that were available during my care of the patient were reviewed by me and considered in my medical decision making (see chart for details).     Presents to the emergency department for evaluation of persistent nausea and vomiting.  Patient has been ill for the last month.  She was initially diagnosed with gallstone pancreatitis.  She was hospitalized here with this illness, had a cyst of her pancreas noted during that hospitalization.  She was treated supportively and discharged, [facility] and had her gallbladder taken out 2 weeks ago.  It seems as though the gallbladder surgery went well and she does not have anything that would resemble acute complications of the gallbladder surgery, but she has had persistent and intractable vomiting.  Patient is not febrile, she does not have any right upper quadrant pain, she has normal LFTs.  She does, however, have an acute kidney injury secondary to the amount of vomiting she has been experiencing.  Repeat CT was read as possible abscess versus necrosis.  This was discussed with Dr. Magnus IvanBlackman, on-call for general surgery.  He did not feel  that the patient would need emergent pancreatic surgery at this time, recommends hospitalist admission and surgery will see this morning.  Final Clinical Impressions(s) / ED Diagnoses   Final diagnoses:  Intractable vomiting with nausea, unspecified vomiting type  AKI (acute kidney injury) [facility](HCC)    ED Discharge Orders    None       Blinda LeatherwoodPollina, Canary Brimhristopher J, MD 03/10/18  0632  

## 2018-03-10 NOTE — Consult Note (Signed)
Reason for Consult: pancreatic fluid collection Referring Physician: Dr. Olen Pel Sharon Duncan is an 27 y.o. female.  HPI: Patient is a 27 year old female who was recently admitted secondary to gallstone pancreatitis.  Patient was subsequently discharged from the hospital and was seen at [facility] rocking him.  Patient underwent laparoscopic cholecystectomy by Dr. Ladona Horns and was discharged without any significant events.  Patient states that since that time she has lost approximately 30 to 40 pounds.  She states that she has had no abdominal pain.  She states that she has had some decreased appetite and some nausea minimal emesis.  Upon evaluation the ER she underwent CT scan which revealed concern for possible pancreatic abscess.  Patient underwent MRCP recently.  Patient was found to have likely beginning pancreatic pseudocyst.  There was no definitive pancreatic necrosis.  I did review these scans personally.  Past Medical History:  Diagnosis Date  . CKD (chronic kidney disease), stage III (Sharon Duncan) 03/10/2018  . Depression   . Frequent UTI   . Obesity hypoventilation syndrome (Sharon Duncan) 03/10/2018  . Pancreatitis due to common bile duct stone     Past Surgical History:  Procedure Laterality Date  . CHOLECYSTECTOMY    . KIDNEY SURGERY  2004   ?to lift kidney? d/t frequent UTIs  . TOOTH EXTRACTION N/A 03/03/2014   Procedure: EXTRACTION OF WISDOM TEETH;  Surgeon: Sharon Duncan, DDS;  Location: [place];  Service: Oral Surgery;  Laterality: N/A;    Family History  Problem Relation Age of Onset  . Other Neg Hx     Social History:  reports that she has never smoked. She has never used smokeless tobacco. She reports that she drinks alcohol. She reports that she does not use drugs.  Allergies:  Allergies  Allergen Reactions  . Rocephin [Ceftriaxone] Rash    Medications: I have reviewed the patient's current medications.  Results for orders placed or performed during the hospital encounter of  03/09/18 (from the past 48 hour(s))  Urinalysis, Routine w reflex microscopic     Status: Abnormal   Collection Time: 03/09/18  8:34 PM  Result Value Ref Range   Color, Urine AMBER (A) YELLOW    Comment: BIOCHEMICALS MAY BE AFFECTED BY COLOR   APPearance CLOUDY (A) CLEAR   Specific Gravity, Urine 1.018 1.005 - 1.030   pH 6.0 5.0 - 8.0   Glucose, UA NEGATIVE NEGATIVE mg/dL   Hgb urine dipstick MODERATE (A) NEGATIVE   Bilirubin Urine NEGATIVE NEGATIVE   Ketones, ur NEGATIVE NEGATIVE mg/dL   Protein, ur 100 (A) NEGATIVE mg/dL   Nitrite NEGATIVE NEGATIVE   Leukocytes, UA LARGE (A) NEGATIVE   RBC / HPF >50 (H) 0 - 5 RBC/hpf   WBC, UA >50 (H) 0 - 5 WBC/hpf   Bacteria, UA MANY (A) NONE SEEN   Squamous Epithelial / LPF 6-10 0 - 5   WBC Clumps PRESENT    Mucus PRESENT    Non Squamous Epithelial 0-5 (A) NONE SEEN    Comment: Performed at [facility], [address], [place], [place] [address]  Lipase, blood     Status: None   Collection Time: 03/09/18 10:20 PM  Result Value Ref Range   Lipase 39 11 - 51 U/L    Comment: Performed at [facility] [address] [place], [place] [address]  Comprehensive metabolic panel     Status: Abnormal   Collection Time: 03/09/18 10:20 PM  Result Value Ref Range   Sodium  141 135 - 145 mmol/L   Potassium 3.3 (L) 3.5 - 5.1 mmol/L   Chloride 104 98 - 111 mmol/L   CO2 23 22 - 32 mmol/L   Glucose, Bld 145 (H) 70 - 99 mg/dL   BUN 10 6 - 20 mg/dL   Creatinine, Ser 1.90 (H) 0.44 - 1.00 mg/dL   Calcium 9.1 8.9 - 10.3 mg/dL   Total Protein 8.1 6.5 - 8.1 g/dL   Albumin 3.7 3.5 - 5.0 g/dL   AST 35 15 - 41 U/L   ALT 37 0 - 44 U/L   Alkaline Phosphatase 118 38 - 126 U/L   Total Bilirubin 1.8 (H) 0.3 - 1.2 mg/dL   GFR calc non Af Amer 36 (L) >60 mL/min   GFR calc Af Amer 41 (L) >60 mL/min    Comment: (NOTE) The eGFR has been calculated using the CKD EPI equation. This calculation has not been validated in all clinical situations. eGFR's  persistently <60 mL/min signify possible Chronic Kidney Disease.    Anion gap 14 5 - 15    Comment: Performed at [facility] [address], [place], [place] [address]  CBC     Status: Abnormal   Collection Time: 03/09/18 10:20 PM  Result Value Ref Range   WBC 10.3 4.0 - 10.5 K/uL   RBC 4.40 3.87 - 5.11 MIL/uL   Hemoglobin 12.0 12.0 - 15.0 g/dL   HCT 37.9 36.0 - 46.0 %   MCV 86.1 78.0 - 100.0 fL   MCH 27.3 26.0 - 34.0 pg   MCHC 31.7 30.0 - 36.0 g/dL   RDW 16.2 (H) 11.5 - 15.5 %   Platelets 229 150 - 400 K/uL    Comment: Performed at [facility] [address], [place], [place] [address]  I-Stat beta hCG blood, ED     Status: None   Collection Time: 03/09/18 10:29 PM  Result Value Ref Range   I-stat hCG, quantitative <5.0 <5 mIU/mL   Comment 3            Comment:   GEST. AGE      CONC.  (mIU/mL)   <=1 WEEK        5 - 50     2 WEEKS       50 - 500     3 WEEKS       100 - 10,000     4 WEEKS     1,000 - 30,000        FEMALE AND NON-PREGNANT FEMALE:     LESS THAN 5 mIU/mL     Ct Abdomen Pelvis W Contrast  Result Date: 03/10/2018 CLINICAL DATA:  Two weeks post cholecystectomy. Nausea and vomiting for 3 days. Generalized abdominal pain and fever. EXAM: CT ABDOMEN AND PELVIS WITH CONTRAST TECHNIQUE: Multidetector CT imaging of the abdomen and pelvis was performed using the standard protocol following bolus administration of intravenous contrast. CONTRAST:  22m ISOVUE-300 IOPAMIDOL (ISOVUE-300) INJECTION 61% COMPARISON:  None. FINDINGS: Lower chest: Lung bases are clear. Hepatobiliary: Mild diffuse fatty infiltration of the liver. No focal liver lesions. Surgical absence of the gallbladder. Fluid and a small amount of gas in the gallbladder fossa measuring 1.8 x 3.2 cm. This is nonspecific and could represent postoperative collection, abscess, or leak. Pancreas: Circumscribed collection arising from the body and tail of the pancreas and extending underneath the lesser  curvature of the stomach measuring 3.7 x 5.2 cm in diameter. This most likely represents  an area of walled off necrosis associated with acute pancreatitis. Abscess would be another consideration, less likely. There is infiltration and edema around the tail of the pancreas and along the left anterior pararenal space. Spleen: Normal in size without focal abnormality. Adrenals/Urinary Tract: No adrenal gland nodules. Bilateral renal parenchymal scarring and atrophy, greater on the left. No hydronephrosis or hydroureter. Nephrograms are symmetrical. Bladder is mostly decompressed. Stomach/Bowel: Stomach, small bowel, and colon are mostly decompressed. No wall thickening is appreciated although evaluation of wall is limited due to under distention. No inflammatory infiltrations. Appendix is normal. Vascular/Lymphatic: No significant vascular findings are present. No enlarged abdominal or pelvic lymph nodes. Reproductive: Uterus and bilateral adnexa are unremarkable. Other: No free air in the abdomen. Abdominal wall musculature appears intact. Musculoskeletal: No acute or significant osseous findings. IMPRESSION: 1. Fluid and gas in the gallbladder fossa with small amount of gas in the gallbladder fossa. This is probably a postoperative collection, but could represent abscess or leak. Surgical absence of the gallbladder. 2. Circumscribed collection arising from the body and tail of the pancreas. This most likely represents an area of walled off necrosis associated with acute pancreatitis. Abscess would be another consideration. 3. Mild diffuse fatty infiltration of the liver. Electronically Signed   By: Lucienne Capers M.D.   On: 03/10/2018 04:56   Mr Abdomen With Mrcp W Contrast  Result Date: 03/10/2018 CLINICAL DATA:  Abdominal pain. Acute pancreatitis. Prior cholecystectomy. Weight loss. EXAM: MRI ABDOMEN WITH CONTRAST (WITH MRCP) TECHNIQUE: Multiplanar multisequence MR imaging of the abdomen was performed  following the administration of intravenous contrast. Heavily T2-weighted images of the biliary and pancreatic ducts were obtained, and three-dimensional MRCP images were rendered by post processing. CONTRAST:  72m MULTIHANCE GADOBENATE DIMEGLUMINE 529 MG/ML IV SOLN COMPARISON:  03/10/2018 CT. FINDINGS: Mild to moderate degradation, secondary to motion and suboptimal contrast opacification. Contrast dose was minimized secondary to elevated creatinine. Lower chest: Normal heart size without pericardial or pleural effusion. Hepatobiliary: No suspicious liver lesion. There is moderate hepatic steatosis with wedge-shaped areas of sparing within the right hepatic lobe on series 1002. Cholecystectomy, without intra or extrahepatic biliary duct dilatation. There is ill-defined complex fluid and edema within the operative bed, including at 3.0 x 2.1 cm on 20/8. No choledocholithiasis. Pancreas: No pancreas divisum or main duct dilatation. There is moderate edema within the anterior pararenal space. A complex fluid collection centered in the lesser sac causes mass effect upon the pancreatic body/tail junction. This impresses into the stomach and may involve the lesser curvature, including on image 68/304. No definite pancreatic necrosis. Spleen:  Normal in size, without focal abnormality. Adrenals/Urinary Tract: Normal adrenal glands. Moderate left and mild right renal scarring. No hydronephrosis. Stomach/Bowel: The gastric body is thickened, primarily the lesser curvature. Normal abdominal large and small bowel loops. Vascular/Lymphatic: Normal caliber of the aorta and branch vessels. Nonocclusive thrombus is suspected in the splenic vein, including on 56/303 and the coronal reformat from today's CT (image 55/6 of that study). No gross abdominal adenopathy. Other:  No ascites. Musculoskeletal: No acute osseous abnormality. IMPRESSION: 1. Motion and suboptimal contrast opacification degradation, as above. 2. No biliary duct  dilatation or choledocholithiasis. 3. Complicated pancreatitis, with a complex fluid collection in the lesser sac, likely representing developing pseudocyst. This causes mass effect upon the gastric body and may involve the wall of the lesser curvature. Presumably secondary gastric wall thickening indicative of gastritis. Nonocclusive thrombus in the splenic vein. 4. Ill-defined fluid collection within the cholecystectomy bed.  Differential considerations include postoperative seroma, biloma, or less likely abscess, given absence of significant enhancement. If the cholecystectomy was in the last 5-7 days, this could be a normal postoperative finding. 5. Heterogeneous hepatic steatosis. 6. Renal atrophy, worse on the left. Electronically Signed   By: Abigail Miyamoto M.D.   On: 03/10/2018 14:48    Review of Systems  Constitutional: Negative for chills, fever and malaise/fatigue.  HENT: Negative for ear discharge, hearing loss and sore throat.   Eyes: Negative for blurred vision and discharge.  Respiratory: Negative for cough and shortness of breath.   Cardiovascular: Negative for chest pain, orthopnea and leg swelling.  Gastrointestinal: Negative for abdominal pain, constipation, diarrhea, heartburn, nausea and vomiting.  Musculoskeletal: Negative for myalgias and neck pain.  Skin: Negative for itching and rash.  Neurological: Negative for dizziness, focal weakness, seizures and loss of consciousness.  Endo/Heme/Allergies: Negative for environmental allergies. Does not bruise/bleed easily.  Psychiatric/Behavioral: Negative for depression and suicidal ideas.  All other systems reviewed and are negative.  Blood pressure (!) 136/95, pulse 85, temperature 98.2 F (36.8 C), temperature source Oral, resp. rate 18, height '5\' 4"'  (1.626 m), weight 104.3 kg, last menstrual period 03/08/2018, SpO2 99 %, unknown if currently breastfeeding. Physical Exam  Constitutional: She is oriented to person, place, and time.  Vital signs are normal. She appears well-developed and well-nourished.  Conversant No acute distress  Eyes: Lids are normal. No scleral icterus.  No lid lag Moist conjunctiva  Neck: No tracheal tenderness present. No thyromegaly present.  No cervical lymphadenopathy  Cardiovascular: Normal rate, regular rhythm and intact distal pulses.  No murmur heard. Respiratory: Effort normal and breath sounds normal. She has no wheezes. She has no rales.  GI: Soft. She exhibits no distension and no mass. There is no hepatosplenomegaly. There is no tenderness. There is no rebound and no guarding. No hernia.  Neurological: She is alert and oriented to person, place, and time.  Normal gait and station  Skin: Skin is warm. No rash noted. No cyanosis. Nails show no clubbing.  Normal skin turgor  Psychiatric: Judgment normal.  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  Patient currently with normal lipase, and no white count.  The pseudocyst is likely to resolve on its own.  If pseudocyst does not resolve in 6-8 weeks that she may be a candidate for enteric pseudocyst drainage.  Patient may be able to follow-up at [facility] rocking him with her original surgeon for this issue.  No current surgical plans or needs.  Rosario Jacks., Annalynne Ibanez 03/10/2018, 3:01 PM

## 2018-03-11 LAB — CBC
HCT: 30 % — ABNORMAL LOW (ref 36.0–46.0)
Hemoglobin: 9.3 g/dL — ABNORMAL LOW (ref 12.0–15.0)
MCH: 27.5 pg (ref 26.0–34.0)
MCHC: 31 g/dL (ref 30.0–36.0)
MCV: 88.8 fL (ref 78.0–100.0)
Platelets: 176 K/uL (ref 150–400)
RBC: 3.38 MIL/uL — ABNORMAL LOW (ref 3.87–5.11)
RDW: 16.6 % — ABNORMAL HIGH (ref 11.5–15.5)
WBC: 5.2 K/uL (ref 4.0–10.5)

## 2018-03-11 LAB — BASIC METABOLIC PANEL
Anion gap: 6 (ref 5–15)
BUN: 6 mg/dL (ref 6–20)
CO2: 25 mmol/L (ref 22–32)
Chloride: 115 mmol/L — ABNORMAL HIGH (ref 98–111)
Glucose, Bld: 102 mg/dL — ABNORMAL HIGH (ref 70–99)
Potassium: 3.9 mmol/L (ref 3.5–5.1)

## 2018-03-11 LAB — BASIC METABOLIC PANEL WITH GFR
Calcium: 8 mg/dL — ABNORMAL LOW (ref 8.9–10.3)
Creatinine, Ser: 1.75 mg/dL — ABNORMAL HIGH (ref 0.44–1.00)
GFR calc Af Amer: 46 mL/min — ABNORMAL LOW (ref >60)
GFR calc non Af Amer: 39 mL/min — ABNORMAL LOW (ref >60)
Sodium: 146 mmol/L — ABNORMAL HIGH (ref 135–145)

## 2018-03-11 LAB — HEPATIC FUNCTION PANEL
ALT: 26 U/L (ref 0–44)
AST: 21 U/L (ref 15–41)
Albumin: 2.8 g/dL — ABNORMAL LOW (ref 3.5–5.0)
Alkaline Phosphatase: 82 U/L (ref 38–126)
Bilirubin, Direct: 0.2 mg/dL (ref 0.0–0.2)
Indirect Bilirubin: 0.8 mg/dL (ref 0.3–0.9)
Total Bilirubin: 1 mg/dL (ref 0.3–1.2)
Total Protein: 6.1 g/dL — ABNORMAL LOW (ref 6.5–8.1)

## 2018-03-11 LAB — TSH: TSH: 1.407 u[IU]/mL (ref 0.350–4.500)

## 2018-03-11 LAB — URINE CULTURE: Culture: NO GROWTH

## 2018-03-11 LAB — CORTISOL: Cortisol, Plasma: 11.5 ug/dL

## 2018-03-11 MED ORDER — SODIUM CHLORIDE 0.45 % IV SOLN
INTRAVENOUS | Status: DC
  Administered 2018-03-11 – 2018-03-13 (×4): via INTRAVENOUS

## 2018-03-11 MED ORDER — PROMETHAZINE HCL 25 MG/ML IJ SOLN: 12.5000 mg | Freq: Four times a day (QID) | INTRAMUSCULAR | Status: DC | PRN

## 2018-03-11 NOTE — Progress Notes (Signed)
PROGRESS NOTE    Sharon Duncan  ZOX:[id number] DOB: 10/10/1990 DOA: 03/09/2018 PCP: System, Pcp Not In   Brief Narrative: Patient is a 27 year old female with past medical history of acute pancreatitis, persistent nausea and vomiting who presented to the emergency department with complaints of nausea and vomiting which has been going on for a while.  Patient was admitted here on July for the management of pancreatitis, pancreatic pseudocyst.  She was diagnosed with this after her pregnancy.  CT abdomen/pelvis done on this admission shows circumcised collection arising from the body and tail of the pancreas concerning for abscess/leak/necrosis.  MRI more suggestive of developing pseudocyst.  GI and general surgery were following.  No plan for any intervention.  Currently being managed for persistent nausea.   Assessment & Plan:   Active Problems:   Nausea & vomiting   Obesity, Class III, BMI 40-49.9 (morbid obesity) (HCC)   CKD (chronic kidney disease), stage III (HCC)  Pancreatic pseudocyst: MRI more suggestive of developing pseudocyst.  No choledocholithiasis. GI and general surgery evaluated the patient and sign off .No plan for intervention.  Mild elevated lipase.  Other liver enzymes normal.  Might take 6 to 8 weeks for resolution.  She is on pancreatic supplements at home.  We will continue that.  She will follow-up with GI/general surgery as an outpatient. Discussed with husband on phone who was very concerned about the plan of conservative management.  Will reach out to surgery tomorrow and discuss further.  Nausea/vomiting: Complains of persistent nausea but vomiting has improved.  Started on Phenergan.  Elevated white cell counts: Most likely reactive.  We will continue to monitor.  Hypernatremia: Continue half-normal saline.  Hypokalemia: Supplemented with potassium this morning.  We will continue to monitor the levels.  Stage III CKD: Her baseline creatinine is around 1.3.  She  has also history of frequent UTI.  Creatinine increased from baseline.  We will continue IV fluids.  Avoid nephrotoxins.  UA not suggestive of UTI.   DVT prophylaxis: Lovenox Code Status: Full Family Communication: Discussed with husband on phone who was very concerned about the plan of conservative management.  Will reach out to surgery tomorrow and discuss further. Disposition Plan: Home after resolution of nausea   Consultants: GI, surgery  Procedures: None  Antimicrobials: None  Subjective: Patient seen and examined the bedside this morning.  Remains comfortable except for nausea.  Denies any abdominal pain or vomiting or diarrhea.  Objective: Vitals:   03/10/18 2110 03/11/18 0457 03/11/18 0510 03/11/18 0511  BP: 119/69 (!) 89/73 (!) 105/54 (!) 107/48  Pulse: 83 (!) 128 73 78  Resp: 18 18    Temp: 98.1 F (36.7 C) (!) 97.4 F (36.3 C)    TempSrc: Oral Oral    SpO2: 100% 100%    Weight:      Height:        Intake/Output Summary (Last 24 hours) at 03/11/2018 1337 Last data filed at 03/11/2018 8119 Gross per 24 hour  Intake 1250 ml  Output 1250 ml  Net 0 ml   Filed Weights   03/09/18 2151  Weight: 104.3 kg    Examination:  General exam: Not in distress,obese HEENT:PERRL,Oral mucosa moist, Ear/Nose normal on gross exam Respiratory system: Bilateral equal air entry, normal vesicular breath sounds, no wheezes or crackles  Cardiovascular system: S1 & S2 heard, RRR. No JVD, murmurs, rubs, gallops or clicks. No pedal edema. Gastrointestinal system: Abdomen is nondistended, soft and nontender. No organomegaly or masses felt.  Normal bowel sounds heard. Central nervous system: Alert and oriented. No focal neurological deficits. Extremities: No edema, no clubbing ,no cyanosis, distal peripheral pulses palpable. Skin: No rashes, lesions or ulcers,no icterus ,no pallor MSK: Normal muscle bulk,tone ,power Psychiatry: Judgement and insight appear normal. Mood & affect  appropriate.     Data Reviewed: I have personally reviewed following labs and imaging studies  CBC: Recent Labs  Lab 03/09/18 2220 03/11/18 0419  WBC 10.3 5.2  HGB 12.0 9.3*  HCT 37.9 30.0*  MCV 86.1 88.8  PLT 229 176   Basic Metabolic Panel: Recent Labs  Lab 03/09/18 2220 03/11/18 0419  NA 141 146*  K 3.3* 3.9  CL 104 115*  CO2 23 25  GLUCOSE 145* 102*  BUN 10 6  CREATININE 1.90* 1.75*  CALCIUM 9.1 8.0*   GFR: Estimated Creatinine Clearance: 57.8 mL/min (A) (by C-G formula based on SCr of 1.75 mg/dL (H)). Liver Function Tests: Recent Labs  Lab 03/09/18 2220 03/11/18 0419  AST 35 21  ALT 37 26  ALKPHOS 118 82  BILITOT 1.8* 1.0  PROT 8.1 6.1*  ALBUMIN 3.7 2.8*   Recent Labs  Lab 03/09/18 2220  LIPASE 39   No results for input(s): AMMONIA in the last 168 hours. Coagulation Profile: No results for input(s): INR, PROTIME in the last 168 hours. Cardiac Enzymes: No results for input(s): CKTOTAL, CKMB, CKMBINDEX, TROPONINI in the last 168 hours. BNP (last 3 results) No results for input(s): PROBNP in the last 8760 hours. HbA1C: No results for input(s): HGBA1C in the last 72 hours. CBG: No results for input(s): GLUCAP in the last 168 hours. Lipid Profile: No results for input(s): CHOL, HDL, LDLCALC, TRIG, CHOLHDL, LDLDIRECT in the last 72 hours. Thyroid Function Tests: Recent Labs    03/11/18 0419  TSH 1.407   Anemia Panel: No results for input(s): VITAMINB12, FOLATE, FERRITIN, TIBC, IRON, RETICCTPCT in the last 72 hours. Sepsis Labs: No results for input(s): PROCALCITON, LATICACIDVEN in the last 168 hours.  No results found for this or any previous visit (from the past 240 hour(s)).       Radiology Studies: Ct Abdomen Pelvis W Contrast  Result Date: 03/10/2018 CLINICAL DATA:  Two weeks post cholecystectomy. Nausea and vomiting for 3 days. Generalized abdominal pain and fever. EXAM: CT ABDOMEN AND PELVIS WITH CONTRAST TECHNIQUE: Multidetector  CT imaging of the abdomen and pelvis was performed using the standard protocol following bolus administration of intravenous contrast. CONTRAST:  80mL ISOVUE-300 IOPAMIDOL (ISOVUE-300) INJECTION 61% COMPARISON:  None. FINDINGS: Lower chest: Lung bases are clear. Hepatobiliary: Mild diffuse fatty infiltration of the liver. No focal liver lesions. Surgical absence of the gallbladder. Fluid and a small amount of gas in the gallbladder fossa measuring 1.8 x 3.2 cm. This is nonspecific and could represent postoperative collection, abscess, or leak. Pancreas: Circumscribed collection arising from the body and tail of the pancreas and extending underneath the lesser curvature of the stomach measuring 3.7 x 5.2 cm in diameter. This most likely represents an area of walled off necrosis associated with acute pancreatitis. Abscess would be another consideration, less likely. There is infiltration and edema around the tail of the pancreas and along the left anterior pararenal space. Spleen: Normal in size without focal abnormality. Adrenals/Urinary Tract: No adrenal gland nodules. Bilateral renal parenchymal scarring and atrophy, greater on the left. No hydronephrosis or hydroureter. Nephrograms are symmetrical. Bladder is mostly decompressed. Stomach/Bowel: Stomach, small bowel, and colon are mostly decompressed. No wall thickening is appreciated although  evaluation of wall is limited due to under distention. No inflammatory infiltrations. Appendix is normal. Vascular/Lymphatic: No significant vascular findings are present. No enlarged abdominal or pelvic lymph nodes. Reproductive: Uterus and bilateral adnexa are unremarkable. Other: No free air in the abdomen. Abdominal wall musculature appears intact. Musculoskeletal: No acute or significant osseous findings. IMPRESSION: 1. Fluid and gas in the gallbladder fossa with small amount of gas in the gallbladder fossa. This is probably a postoperative collection, but could  represent abscess or leak. Surgical absence of the gallbladder. 2. Circumscribed collection arising from the body and tail of the pancreas. This most likely represents an area of walled off necrosis associated with acute pancreatitis. Abscess would be another consideration. 3. Mild diffuse fatty infiltration of the liver. Electronically Signed   By: Burman NievesWilliam  Stevens M.D.   On: 03/10/2018 04:56   Mr Abdomen With Mrcp W Contrast  Result Date: 03/10/2018 CLINICAL DATA:  Abdominal pain. Acute pancreatitis. Prior cholecystectomy. Weight loss. EXAM: MRI ABDOMEN WITH CONTRAST (WITH MRCP) TECHNIQUE: Multiplanar multisequence MR imaging of the abdomen was performed following the administration of intravenous contrast. Heavily T2-weighted images of the biliary and pancreatic ducts were obtained, and three-dimensional MRCP images were rendered by post processing. CONTRAST:  10mL MULTIHANCE GADOBENATE DIMEGLUMINE 529 MG/ML IV SOLN COMPARISON:  03/10/2018 CT. FINDINGS: Mild to moderate degradation, secondary to motion and suboptimal contrast opacification. Contrast dose was minimized secondary to elevated creatinine. Lower chest: Normal heart size without pericardial or pleural effusion. Hepatobiliary: No suspicious liver lesion. There is moderate hepatic steatosis with wedge-shaped areas of sparing within the right hepatic lobe on series 1002. Cholecystectomy, without intra or extrahepatic biliary duct dilatation. There is ill-defined complex fluid and edema within the operative bed, including at 3.0 x 2.1 cm on 20/8. No choledocholithiasis. Pancreas: No pancreas divisum or main duct dilatation. There is moderate edema within the anterior pararenal space. A complex fluid collection centered in the lesser sac causes mass effect upon the pancreatic body/tail junction. This impresses into the stomach and may involve the lesser curvature, including on image 68/304. No definite pancreatic necrosis. Spleen:  Normal in size,  without focal abnormality. Adrenals/Urinary Tract: Normal adrenal glands. Moderate left and mild right renal scarring. No hydronephrosis. Stomach/Bowel: The gastric body is thickened, primarily the lesser curvature. Normal abdominal large and small bowel loops. Vascular/Lymphatic: Normal caliber of the aorta and branch vessels. Nonocclusive thrombus is suspected in the splenic vein, including on 56/303 and the coronal reformat from today's CT (image 55/6 of that study). No gross abdominal adenopathy. Other:  No ascites. Musculoskeletal: No acute osseous abnormality. IMPRESSION: 1. Motion and suboptimal contrast opacification degradation, as above. 2. No biliary duct dilatation or choledocholithiasis. 3. Complicated pancreatitis, with a complex fluid collection in the lesser sac, likely representing developing pseudocyst. This causes mass effect upon the gastric body and may involve the wall of the lesser curvature. Presumably secondary gastric wall thickening indicative of gastritis. Nonocclusive thrombus in the splenic vein. 4. Ill-defined fluid collection within the cholecystectomy bed. Differential considerations include postoperative seroma, biloma, or less likely abscess, given absence of significant enhancement. If the cholecystectomy was in the last 5-7 days, this could be a normal postoperative finding. 5. Heterogeneous hepatic steatosis. 6. Renal atrophy, worse on the left. Electronically Signed   By: Jeronimo GreavesKyle  Talbot M.D.   On: 03/10/2018 14:48        Scheduled Meds: . enoxaparin (LOVENOX) injection  40 mg Subcutaneous Q24H  . feeding supplement (ENSURE ENLIVE)  237 mL Oral  BID BM  . lipase/protease/amylase  36,000 Units Oral TID WC  . mouth rinse  15 mL Mouth Rinse BID   Continuous Infusions: . sodium chloride 75 mL/hr at 03/11/18 0845     LOS: 1 day    Time spent: 35 mins.More than 50% of that time was spent in counseling and/or coordination of care.      Burnadette PopAmrit Kmya Placide, MD [facility] Pager [phone]  If 7PM-7AM, please contact night-coverage [email or web address] Password [email or web address] 03/11/2018, 1:37 PM

## 2018-03-11 NOTE — Progress Notes (Signed)
[facility] Progress Note  Subjective: The patient denies abdominal pain today. Denies nausea or vomiting today. MRCP was done. No choledocholithiasis. Pancreatic pseudocyst present. Patient seen by Dr. Derrell Lollingamirez from surgery. As per his recommendations if this does not clear in 6-8 weeks she may be a candidate for drainage.  Objective: Vital signs in last 24 hours: Temp:  [97.4 F (36.3 C)-98.1 F (36.7 C)] 97.4 F (36.3 C) (08/18 0457) Pulse Rate:  [73-128] 78 (08/18 0511) Resp:  [18] 18 (08/18 0457) BP: (89-119)/(48-73) 107/48 (08/18 0511) SpO2:  [100 %] 100 % (08/18 0457) Weight change:    PE:  No distress  Abdomen soft and nontender  Lab Results: Results for orders placed or performed during the hospital encounter of 03/09/18 (from the past 24 hour(s))  TSH     Status: None   Collection Time: 03/11/18  4:19 AM  Result Value Ref Range   TSH 1.407 0.350 - 4.500 uIU/mL  Cortisol     Status: None   Collection Time: 03/11/18  4:19 AM  Result Value Ref Range   Cortisol, Plasma 11.5 ug/dL  Basic metabolic panel     Status: Abnormal   Collection Time: 03/11/18  4:19 AM  Result Value Ref Range   Sodium 146 (H) 135 - 145 mmol/L   Potassium 3.9 3.5 - 5.1 mmol/L   Chloride 115 (H) 98 - 111 mmol/L   CO2 25 22 - 32 mmol/L   Glucose, Bld 102 (H) 70 - 99 mg/dL   BUN 6 6 - 20 mg/dL   Creatinine, Ser 1.611.75 (H) 0.44 - 1.00 mg/dL   Calcium 8.0 (L) 8.9 - 10.3 mg/dL   GFR calc non Af Amer 39 (L) >60 mL/min   GFR calc Af Amer 46 (L) >60 mL/min   Anion gap 6 5 - 15  CBC     Status: Abnormal   Collection Time: 03/11/18  4:19 AM  Result Value Ref Range   WBC 5.2 4.0 - 10.5 K/uL   RBC 3.38 (L) 3.87 - 5.11 MIL/uL   Hemoglobin 9.3 (L) 12.0 - 15.0 g/dL   HCT 09.630.0 (L) 04.536.0 - 40.946.0 %   MCV 88.8 78.0 - 100.0 fL   MCH 27.5 26.0 - 34.0 pg   MCHC 31.0 30.0 - 36.0 g/dL   RDW 81.116.6 (H) 91.411.5 - 78.215.5 %   Platelets 176 150 - 400 K/uL  Hepatic function panel     Status: Abnormal    Collection Time: 03/11/18  4:19 AM  Result Value Ref Range   Total Protein 6.1 (L) 6.5 - 8.1 g/dL   Albumin 2.8 (L) 3.5 - 5.0 g/dL   AST 21 15 - 41 U/L   ALT 26 0 - 44 U/L   Alkaline Phosphatase 82 38 - 126 U/L   Total Bilirubin 1.0 0.3 - 1.2 mg/dL   Bilirubin, Direct 0.2 0.0 - 0.2 mg/dL   Indirect Bilirubin 0.8 0.3 - 0.9 mg/dL    Studies/Results: Mr Abdomen With Mrcp W Contrast  Result Date: 03/10/2018 CLINICAL DATA:  Abdominal pain. Acute pancreatitis. Prior cholecystectomy. Weight loss. EXAM: MRI ABDOMEN WITH CONTRAST (WITH MRCP) TECHNIQUE: Multiplanar multisequence MR imaging of the abdomen was performed following the administration of intravenous contrast. Heavily T2-weighted images of the biliary and pancreatic ducts were obtained, and three-dimensional MRCP images were rendered by post processing. CONTRAST:  10mL MULTIHANCE GADOBENATE DIMEGLUMINE 529 MG/ML IV SOLN COMPARISON:  03/10/2018 CT. FINDINGS: Mild to moderate degradation, secondary to motion and suboptimal contrast opacification. Contrast  dose was minimized secondary to elevated creatinine. Lower chest: Normal heart size without pericardial or pleural effusion. Hepatobiliary: No suspicious liver lesion. There is moderate hepatic steatosis with wedge-shaped areas of sparing within the right hepatic lobe on series 1002. Cholecystectomy, without intra or extrahepatic biliary duct dilatation. There is ill-defined complex fluid and edema within the operative bed, including at 3.0 x 2.1 cm on 20/8. No choledocholithiasis. Pancreas: No pancreas divisum or main duct dilatation. There is moderate edema within the anterior pararenal space. A complex fluid collection centered in the lesser sac causes mass effect upon the pancreatic body/tail junction. This impresses into the stomach and may involve the lesser curvature, including on image 68/304. No definite pancreatic necrosis. Spleen:  Normal in size, without focal abnormality. Adrenals/Urinary  Tract: Normal adrenal glands. Moderate left and mild right renal scarring. No hydronephrosis. Stomach/Bowel: The gastric body is thickened, primarily the lesser curvature. Normal abdominal large and small bowel loops. Vascular/Lymphatic: Normal caliber of the aorta and branch vessels. Nonocclusive thrombus is suspected in the splenic vein, including on 56/303 and the coronal reformat from today's CT (image 55/6 of that study). No gross abdominal adenopathy. Other:  No ascites. Musculoskeletal: No acute osseous abnormality. IMPRESSION: 1. Motion and suboptimal contrast opacification degradation, as above. 2. No biliary duct dilatation or choledocholithiasis. 3. Complicated pancreatitis, with a complex fluid collection in the lesser sac, likely representing developing pseudocyst. This causes mass effect upon the gastric body and may involve the wall of the lesser curvature. Presumably secondary gastric wall thickening indicative of gastritis. Nonocclusive thrombus in the splenic vein. 4. Ill-defined fluid collection within the cholecystectomy bed. Differential considerations include postoperative seroma, biloma, or less likely abscess, given absence of significant enhancement. If the cholecystectomy was in the last 5-7 days, this could be a normal postoperative finding. 5. Heterogeneous hepatic steatosis. 6. Renal atrophy, worse on the left. Electronically Signed   By: Jeronimo GreavesKyle  Talbot M.D.   On: 03/10/2018 14:48      Assessment: Acute pancreatitis followed by pancreatic pseudocyst. I do not think this is a pancreatic abscess.  Plan:   As per Dr. Derrell Lollingamirez recommendations, follow clinically. If the pseudocyst does not resolve in 6-8 weeks she may be a candidate for drainage. Nothing further to add at this time from a GI standpoint. We will sign off.    Gwenevere AbbotSAM F Jozee Hammer 03/11/2018, 8:58 AM  Pager: [phone] If no answer or after 5 PM call [phone]

## 2018-03-12 LAB — BASIC METABOLIC PANEL
Anion gap: 6 (ref 5–15)
CALCIUM: 7.9 mg/dL — AB (ref 8.9–10.3)
CHLORIDE: 110 mmol/L (ref 98–111)
CO2: 25 mmol/L (ref 22–32)
CREATININE: 1.43 mg/dL — AB (ref 0.44–1.00)
GFR calc Af Amer: 58 mL/min — ABNORMAL LOW (ref >60)
GFR calc non Af Amer: 50 mL/min — ABNORMAL LOW (ref >60)
Glucose, Bld: 89 mg/dL (ref 70–99)
Potassium: 3.5 mmol/L (ref 3.5–5.1)
Sodium: 141 mmol/L (ref 135–145)

## 2018-03-12 MED ORDER — PRO-STAT SUGAR FREE PO LIQD
30.0000 mL | Freq: Every day | ORAL | Status: DC
  Administered 2018-03-12: 30 mL via ORAL
  Filled 2018-03-12 (×3): qty 30

## 2018-03-12 MED ORDER — BOOST / RESOURCE BREEZE PO LIQD CUSTOM
1.0000 | Freq: Three times a day (TID) | ORAL | Status: DC
  Administered 2018-03-13 (×2): 1 via ORAL

## 2018-03-12 NOTE — Plan of Care (Signed)

## 2018-03-12 NOTE — Progress Notes (Signed)
Spoke with pt's husband regarding the pancreatic pseudocyst. Discussed conservative management for these and pt expressed understanding. He has questions regarding upper endoscopy and pancreatic stent placement that he thought was going to be done. I explained that he would need to speak to GI regarding this. I informed Dr. Renford DillsAdhikari that the pt's husband has questions for GI. Please page us with any needs or questions regarding this patient's care.   Mattie MarlinJessica Focht, [facility] Pager [phone]

## 2018-03-12 NOTE — Progress Notes (Addendum)
Initial Nutrition Assessment  DOCUMENTATION CODES:   Obesity unspecified  INTERVENTION:   -Boost Breeze po TID, each supplement provides 250 kcal and 9 grams of protein  -30 ml Prostat once daily, each supplement provides 100 kcals and 15 grams protein.   -Recommend increasing dose of Creon to two 36,000 unit caspules per meal and one 36,000 unit capsule per snack.   ** If poor PO intake continues recommend placement of post pyloric cortrak.   NUTRITION DIAGNOSIS:   Inadequate oral intake related to altered GI function, nausea, vomiting as evidenced by per patient/family report.  GOAL:   Patient will meet greater than or equal to 90% of their needs  MONITOR:   PO intake, Supplement acceptance, Weight trends, Labs  REASON FOR ASSESSMENT:   Consult Assessment of nutrition requirement/status  ASSESSMENT:   Patient with PMH significant for CKD III, depression, frequent UTI, pancreatitis due to common bile duct stone s/p cholecystecomy, and recent vaginal baby delivery (01/26/18). Presents this admission with ongoing nausea/vomting with subsequent wt loss. Pt admitted for likely developing pancreatic pseudocyst.   Pt reports having a loss in appetite staring 4 months ago when she entered her third trimester. States during this time period she would eat 1 meal per day that consisted of fish, vegetables, steak, and chicken. Reports as time passed her nausea worsened and she could only tolerate bites. Pt's appetite did not increase s/p cholecystectomy. She would not feel pain upon eating just extreme nausea. Pt is currently on a clear liquid diet. Discussed the importance of protein intake while on a clear liquid diet to meet her daily needs. Pt amendable to Boost Breeze but refuses prostat as these make her sick. RD observed meal tray with chicken broth at bedside. Pt had a couple of sips then felt nauseous so she stopped. Pt believes phenergan helps her nausea the most.  Encouraged PO  intake and discussed possible post pyloric feeding tube briefly if PO intake remains poor. Would recommend increasing Creon dosage as guidelines suggest up to 260,750 units/day for adults at her same weight.   Pt endorses a pre pregnancy wt of 290 lb and a post pregnancy weight of 260 lb. She reports a 30 lb wt loss within the recent month and attributes this to poor PO intake. Records indicate pt weighed 268 lb 02/03/18 and 230 lb this admission. Suspect this wt loss is a combination of child birth and poor PO intake. Nutrition-Focused physical exam completed.   Medications reviewed and include: Creon TID (36,000 units), NS @ 75 ml/hr Labs reviewed.   NUTRITION - FOCUSED PHYSICAL EXAM:    Most Recent Value  Orbital Region  No depletion  Upper Arm Region  No depletion  Thoracic and Lumbar Region  No depletion  Buccal Region  No depletion  Temple Region  No depletion  Clavicle Bone Region  No depletion  Clavicle and Acromion Bone Region  No depletion  Scapular Bone Region  No depletion  Dorsal Hand  No depletion  Patellar Region  No depletion  Anterior Thigh Region  No depletion  Posterior Calf Region  No depletion  Edema (RD Assessment)  Mild  Hair  Reviewed  Eyes  Reviewed  Mouth  Reviewed  Skin  Reviewed  Nails  Reviewed     Diet Order:   Diet Order            Diet clear liquid Room service appropriate? Yes; Fluid consistency: Thin  Diet effective now  EDUCATION NEEDS:   Education needs have been addressed  Skin:  Skin Assessment: Reviewed RN Assessment  Last BM:  03/12/18  Height:   Ht Readings from Last 1 Encounters:  03/10/18 5\' 4"  (1.626 m)    Weight:   Wt Readings from Last 1 Encounters:  03/09/18 104.3 kg    Ideal Body Weight:  54.5 kg  BMI:  Body mass index is 39.48 kg/m.  Estimated Nutritional Needs:   Kcal:  1750-1950 kcal  Protein:  85-95 grams   Fluid:  >/= 1.7 L/day   Vanessa Kickarly Magally Vahle RD, LDN Clinical Nutrition Pager # -  [phone]

## 2018-03-12 NOTE — Progress Notes (Signed)
PROGRESS NOTE    Sharon BeltonKayla Duncan  RUE:[id number] DOB: 12/29/1990 DOA: 03/09/2018 PCP: System, Pcp Not In   Brief Narrative: Patient is a 27 year old female with past medical history of acute pancreatitis, persistent nausea and vomiting who presented to the emergency department with complaints of nausea and vomiting which has been going on for a while.  Patient was admitted here on July for the management of pancreatitis, pancreatic pseudocyst.  She was diagnosed with this after her pregnancy.  CT abdomen/pelvis done on this admission shows circumcised collection arising from the body and tail of the pancreas concerning for abscess/leak/necrosis.  MRI more suggestive of developing pseudocyst.  GI and general surgery were following.  No plan for any intervention.  Currently being managed for persistent nausea.   Assessment & Plan:   Active Problems:   Nausea & vomiting   Obesity, Class III, BMI 40-49.9 (morbid obesity) (HCC)   CKD (chronic kidney disease), stage III (HCC)  Pancreatic pseudocyst: MRI more suggestive of developing pseudocyst.  No choledocholithiasis. GI and general surgery evaluated the patient  .No plan for intervention.  Mildly elevated lipase.  Other liver enzymes normal.  Might take 6 to 8 weeks for resolution.  She is on pancreatic supplements at home.  We will continue that.  She will follow-up with GI/general surgery as an outpatient. Discussed with husband on phone who was very concerned about the plan of conservative management.  General surgery and gastroenterology discussed with the husband again.  There is no plan for any surgical intervention.  GI following and recommended to monitor her.  Will request for dietitian to see her to boost her appetite.  If her oral intake continues to poor, GI recommending NG tube placement for feeding.  The plan is to wait for 6 to 8 weeks until the pseudocyst matures and then she can have the endoscopic procedure for stent placement or have  surgical procedure to remove the pseudocyst as an alternative.  Nausea/vomiting: Complains of persistent nausea but vomiting has improved.  Started on Phenergan for nausea.  Hypernatremia: Continue half-normal saline.  Hypokalemia: Supplemented with potassium.  Stage III CKD: Her baseline creatinine is around 1.3.  She has also history of frequent UTI.  Creatinine increased from baseline but is improving .  We will continue IV fluids.  Avoid nephrotoxins.  UA not suggestive of UTI.   DVT prophylaxis: Lovenox Code Status: Full Family Communication: Discussed with husband at bedside Disposition Plan: Home after resolution of nausea, improvement in the lower intake.   Consultants: GI, surgery  Procedures: None  Antimicrobials: None  Subjective: Patient seen and examined the bedside this morning.  Remains comfortable except for nausea. Threw up once this morning .Denies any abdominal pain or diarrhea.  Objective: Vitals:   03/11/18 0511 03/11/18 1733 03/11/18 2158 03/12/18 0535  BP: (!) 107/48 122/76 132/85 120/69  Pulse: 78 73 76 70  Resp:  18 18 18   Temp:  98.2 F (36.8 C) 97.7 F (36.5 C) 98.1 F (36.7 C)  TempSrc:  Oral Oral Oral  SpO2:  100% 100% 100%  Weight:      Height:        Intake/Output Summary (Last 24 hours) at 03/12/2018 1304 Last data filed at 03/12/2018 1111 Gross per 24 hour  Intake 996.91 ml  Output 75 ml  Net 921.91 ml   Filed Weights   03/09/18 2151  Weight: 104.3 kg    Examination:  General exam: Not in distress,obese HEENT:PERRL,Oral mucosa moist, Ear/Nose normal on  gross exam Respiratory system: Bilateral equal air entry, normal vesicular breath sounds, no wheezes or crackles  Cardiovascular system: S1 & S2 heard, RRR. No JVD, murmurs, rubs, gallops or clicks. No pedal edema. Gastrointestinal system: Abdomen is nondistended, soft and nontender. No organomegaly or masses felt. Normal bowel sounds heard. Central nervous system: Alert and  oriented. No focal neurological deficits. Extremities: No edema, no clubbing ,no cyanosis, distal peripheral pulses palpable. Skin: No rashes, lesions or ulcers,no icterus ,no pallor MSK: Normal muscle bulk,tone ,power Psychiatry: Judgement and insight appear normal. Mood & affect appropriate.     Data Reviewed: I have personally reviewed following labs and imaging studies  CBC: Recent Labs  Lab 03/09/18 2220 03/11/18 0419  WBC 10.3 5.2  HGB 12.0 9.3*  HCT 37.9 30.0*  MCV 86.1 88.8  PLT 229 176   Basic Metabolic Panel: Recent Labs  Lab 03/09/18 2220 03/11/18 0419 03/12/18 0806  NA 141 146* 141  K 3.3* 3.9 3.5  CL 104 115* 110  CO2 23 25 25   GLUCOSE 145* 102* 89  BUN 10 6 <5*  CREATININE 1.90* 1.75* 1.43*  CALCIUM 9.1 8.0* 7.9*   GFR: Estimated Creatinine Clearance: 70.7 mL/min (A) (by C-G formula based on SCr of 1.43 mg/dL (H)). Liver Function Tests: Recent Labs  Lab 03/09/18 2220 03/11/18 0419  AST 35 21  ALT 37 26  ALKPHOS 118 82  BILITOT 1.8* 1.0  PROT 8.1 6.1*  ALBUMIN 3.7 2.8*   Recent Labs  Lab 03/09/18 2220  LIPASE 39   No results for input(s): AMMONIA in the last 168 hours. Coagulation Profile: No results for input(s): INR, PROTIME in the last 168 hours. Cardiac Enzymes: No results for input(s): CKTOTAL, CKMB, CKMBINDEX, TROPONINI in the last 168 hours. BNP (last 3 results) No results for input(s): PROBNP in the last 8760 hours. HbA1C: No results for input(s): HGBA1C in the last 72 hours. CBG: No results for input(s): GLUCAP in the last 168 hours. Lipid Profile: No results for input(s): CHOL, HDL, LDLCALC, TRIG, CHOLHDL, LDLDIRECT in the last 72 hours. Thyroid Function Tests: Recent Labs    03/11/18 0419  TSH 1.407   Anemia Panel: No results for input(s): VITAMINB12, FOLATE, FERRITIN, TIBC, IRON, RETICCTPCT in the last 72 hours. Sepsis Labs: No results for input(s): PROCALCITON, LATICACIDVEN in the last 168 hours.  Recent Results  (from the past 240 hour(s))  Urine Culture     Status: None   Collection Time: 03/10/18  6:55 PM  Result Value Ref Range Status   Specimen Description URINE, RANDOM  Final   Special Requests NONE  Final   Culture   Final    NO GROWTH Performed at [facility], [address] [place], [place] [address]    Report Status 03/11/2018 FINAL  Final         Radiology Studies: Mr Abdomen Mrcp Vivien RossettiW Wo Contast  Result Date: 03/10/2018 CLINICAL DATA:  Abdominal pain. Acute pancreatitis. Prior cholecystectomy. Weight loss. EXAM: MRI ABDOMEN WITH CONTRAST (WITH MRCP) TECHNIQUE: Multiplanar multisequence MR imaging of the abdomen was performed following the administration of intravenous contrast. Heavily T2-weighted images of the biliary and pancreatic ducts were obtained, and three-dimensional MRCP images were rendered by post processing. CONTRAST:  10mL MULTIHANCE GADOBENATE DIMEGLUMINE 529 MG/ML IV SOLN COMPARISON:  03/10/2018 CT. FINDINGS: Mild to moderate degradation, secondary to motion and suboptimal contrast opacification. Contrast dose was minimized secondary to elevated creatinine. Lower chest: Normal heart size without pericardial or pleural effusion. Hepatobiliary: No  suspicious liver lesion. There is moderate hepatic steatosis with wedge-shaped areas of sparing within the right hepatic lobe on series 1002. Cholecystectomy, without intra or extrahepatic biliary duct dilatation. There is ill-defined complex fluid and edema within the operative bed, including at 3.0 x 2.1 cm on 20/8. No choledocholithiasis. Pancreas: No pancreas divisum or main duct dilatation. There is moderate edema within the anterior pararenal space. A complex fluid collection centered in the lesser sac causes mass effect upon the pancreatic body/tail junction. This impresses into the stomach and may involve the lesser curvature, including on image 68/304. No definite pancreatic necrosis. Spleen:  Normal in size, without focal  abnormality. Adrenals/Urinary Tract: Normal adrenal glands. Moderate left and mild right renal scarring. No hydronephrosis. Stomach/Bowel: The gastric body is thickened, primarily the lesser curvature. Normal abdominal large and small bowel loops. Vascular/Lymphatic: Normal caliber of the aorta and branch vessels. Nonocclusive thrombus is suspected in the splenic vein, including on 56/303 and the coronal reformat from today's CT (image 55/6 of that study). No gross abdominal adenopathy. Other:  No ascites. Musculoskeletal: No acute osseous abnormality. IMPRESSION: 1. Motion and suboptimal contrast opacification degradation, as above. 2. No biliary duct dilatation or choledocholithiasis. 3. Complicated pancreatitis, with a complex fluid collection in the lesser sac, likely representing developing pseudocyst. This causes mass effect upon the gastric body and may involve the wall of the lesser curvature. Presumably secondary gastric wall thickening indicative of gastritis. Nonocclusive thrombus in the splenic vein. 4. Ill-defined fluid collection within the cholecystectomy bed. Differential considerations include postoperative seroma, biloma, or less likely abscess, given absence of significant enhancement. If the cholecystectomy was in the last 5-7 days, this could be a normal postoperative finding. 5. Heterogeneous hepatic steatosis. 6. Renal atrophy, worse on the left. Electronically Signed   By: Jeronimo Greaves M.D.   On: 03/10/2018 14:48        Scheduled Meds: . enoxaparin (LOVENOX) injection  40 mg Subcutaneous Q24H  . feeding supplement (ENSURE ENLIVE)  237 mL Oral BID BM  . lipase/protease/amylase  36,000 Units Oral TID WC  . mouth rinse  15 mL Mouth Rinse BID   Continuous Infusions: . sodium chloride 75 mL/hr at 03/12/18 1303     LOS: 2 days    Time spent: 25 mins.More than 50% of that time was spent in counseling and/or coordination of care.      Burnadette Pop, MD [facility] Pager [phone]  If 7PM-7AM, please contact night-coverage [email or web address] Password [email or web address] 03/12/2018, 1:04 PM

## 2018-03-12 NOTE — Progress Notes (Signed)
[facility] PROGRESS NOTE Subjective Patient states that she is continuing to be nauseated and that the Phenergan is not helping.  She continues to vomit and remains only on clear liquids.  She delivered her baby 7/5 and was having nausea before then.  Ultrasound showing gallstones.  CT scan done this admission and MRI suggest fluid collection in the pancreas with abscess/necrosis/developing pseudocyst.  Has been seen by general surgery who have recommended 8 weeks at least to allow the fluid collection to mature prior to considering any type of procedure.  Objective: Vital signs in last 24 hours: Temp:  [97.7 F (36.5 C)-98.2 F (36.8 C)] 98.1 F (36.7 C) (08/19 0535) Pulse Rate:  [70-76] 70 (08/19 0535) Resp:  [18] 18 (08/19 0535) BP: (120-132)/(69-85) 120/69 (08/19 0535) SpO2:  [100 %] 100 % (08/19 0535) Last BM Date: 03/12/18  Intake/Output from previous day: 08/18 0701 - 08/19 0700 In: 996.9 [I.V.:996.9] Out: -  Intake/Output this shift: Total I/O In: 0  Out: 75 [Emesis/NG output:75]  PE: General--tearful white female.  She is in the room with her 3 children and new baby  Abdomen--nondistended with good bowel sounds. Lab Results: Recent Labs    03/09/18 2220 03/11/18 0419  WBC 10.3 5.2  HGB 12.0 9.3*  HCT 37.9 30.0*  PLT 229 176   BMET Recent Labs    03/09/18 2220 03/11/18 0419 03/12/18 0806  NA 141 146* 141  K 3.3* 3.9 3.5  CL 104 115* 110  CO2 23 25 25   CREATININE 1.90* 1.75* 1.43*   LFT Recent Labs    03/09/18 2220 03/11/18 0419  PROT 8.1 6.1*  AST 35 21  ALT 37 26  ALKPHOS 118 82  BILITOT 1.8* 1.0  BILIDIR  --  0.2  IBILI  --  0.8   PT/INR No results for input(s): LABPROT, INR in the last 72 hours. PANCREAS Recent Labs    03/09/18 2220  LIPASE 39         Studies/Results: Mr Abdomen Mrcp W Wo Contast  Result Date: 03/10/2018 CLINICAL DATA:  Abdominal pain. Acute pancreatitis. Prior cholecystectomy. Weight loss. EXAM:  MRI ABDOMEN WITH CONTRAST (WITH MRCP) TECHNIQUE: Multiplanar multisequence MR imaging of the abdomen was performed following the administration of intravenous contrast. Heavily T2-weighted images of the biliary and pancreatic ducts were obtained, and three-dimensional MRCP images were rendered by post processing. CONTRAST:  10mL MULTIHANCE GADOBENATE DIMEGLUMINE 529 MG/ML IV SOLN COMPARISON:  03/10/2018 CT. FINDINGS: Mild to moderate degradation, secondary to motion and suboptimal contrast opacification. Contrast dose was minimized secondary to elevated creatinine. Lower chest: Normal heart size without pericardial or pleural effusion. Hepatobiliary: No suspicious liver lesion. There is moderate hepatic steatosis with wedge-shaped areas of sparing within the right hepatic lobe on series 1002. Cholecystectomy, without intra or extrahepatic biliary duct dilatation. There is ill-defined complex fluid and edema within the operative bed, including at 3.0 x 2.1 cm on 20/8. No choledocholithiasis. Pancreas: No pancreas divisum or main duct dilatation. There is moderate edema within the anterior pararenal space. A complex fluid collection centered in the lesser sac causes mass effect upon the pancreatic body/tail junction. This impresses into the stomach and may involve the lesser curvature, including on image 68/304. No definite pancreatic necrosis. Spleen:  Normal in size, without focal abnormality. Adrenals/Urinary Tract: Normal adrenal glands. Moderate left and mild right renal scarring. No hydronephrosis. Stomach/Bowel: The gastric body is thickened, primarily the lesser curvature. Normal abdominal large and small bowel loops. Vascular/Lymphatic: Normal caliber of the  aorta and branch vessels. Nonocclusive thrombus is suspected in the splenic vein, including on 56/303 and the coronal reformat from today's CT (image 55/6 of that study). No gross abdominal adenopathy. Other:  No ascites. Musculoskeletal: No acute osseous  abnormality. IMPRESSION: 1. Motion and suboptimal contrast opacification degradation, as above. 2. No biliary duct dilatation or choledocholithiasis. 3. Complicated pancreatitis, with a complex fluid collection in the lesser sac, likely representing developing pseudocyst. This causes mass effect upon the gastric body and may involve the wall of the lesser curvature. Presumably secondary gastric wall thickening indicative of gastritis. Nonocclusive thrombus in the splenic vein. 4. Ill-defined fluid collection within the cholecystectomy bed. Differential considerations include postoperative seroma, biloma, or less likely abscess, given absence of significant enhancement. If the cholecystectomy was in the last 5-7 days, this could be a normal postoperative finding. 5. Heterogeneous hepatic steatosis. 6. Renal atrophy, worse on the left. Electronically Signed   By: Jeronimo GreavesKyle  Talbot M.D.   On: 03/10/2018 14:48    Medications: I have reviewed the patient's current medications.  Assessment:   1.  Complex fluid collection in the tail of the pancreas.  This is compressing the stomach on the lesser sac.  It is possible that this is necrosis but also could be a developing pseudocyst.  Had a long discussion with the patient and her husband about the need to allow this to mature for about 8 weeks.  Hopefully it would resolve by then but if not choices would be percutaneous drainage via IR, EUS directed stent placement to the stomach with drainage into the stomach, surgical drainage.  I have reinforced to them that this is too early in the process to perform any type of procedures at this point that the main thing is to control her nausea and provide adequate nutrition.   Plan: 1.  Would have the dietitian meet with the patient to see if food supplements particularly low-fat supplements would improve her nutrition and allow her to stay on this and clear liquids for the next couple weeks and then repeat CT scan.  If this  will not work we may need to consider placing a nasoduodenal feeding tube and provide nutrition utilizing this for the next 2 to 3 weeks and then repeat the CT scan.   Tresea MallJames L Dessa Ledee 03/12/2018, 1:03 PM  This note was created using voice recognition software. Minor errors may Have occurred unintentionally.  Pager: [phone] If no answer or after hours call [phone]

## 2018-03-13 LAB — BASIC METABOLIC PANEL
Anion gap: 12 (ref 5–15)
BUN: <5 mg/dL — ABNORMAL LOW (ref 6–20)
CALCIUM: 8.2 mg/dL — AB (ref 8.9–10.3)
CHLORIDE: 105 mmol/L (ref 98–111)
CO2: 25 mmol/L (ref 22–32)
Creatinine, Ser: 1.29 mg/dL — ABNORMAL HIGH (ref 0.44–1.00)
GFR calc non Af Amer: 57 mL/min — ABNORMAL LOW (ref >60)
GLUCOSE: 85 mg/dL (ref 70–99)
Potassium: 3.3 mmol/L — ABNORMAL LOW (ref 3.5–5.1)
Sodium: 142 mmol/L (ref 135–145)

## 2018-03-13 MED ORDER — PANCRELIPASE (LIP-PROT-AMYL) 12000-38000 UNITS PO CPEP
72000.0000 [IU] | ORAL_CAPSULE | Freq: Three times a day (TID) | ORAL | Status: DC
  Administered 2018-03-13 (×2): 72000 [IU] via ORAL
  Filled 2018-03-13 (×3): qty 6

## 2018-03-13 MED ORDER — ONDANSETRON HCL 4 MG/2ML IJ SOLN: 4.0000 mg | Freq: Four times a day (QID) | INTRAMUSCULAR | Status: DC | PRN

## 2018-03-13 MED ORDER — POTASSIUM CHLORIDE CRYS ER 20 MEQ PO TBCR
40.0000 meq | EXTENDED_RELEASE_TABLET | Freq: Once | ORAL | Status: AC
  Administered 2018-03-13: 40 meq via ORAL
  Filled 2018-03-13: qty 2

## 2018-03-13 MED ORDER — SODIUM CHLORIDE 0.9 % IV SOLN
INTRAVENOUS | Status: DC
  Administered 2018-03-13 – 2018-03-17 (×7): via INTRAVENOUS

## 2018-03-13 NOTE — Progress Notes (Signed)
Subjective: Patient was seen and examined at bedside. Notes from dietitian reviewed. Patient continues to be nauseous and had 2 episodes of emesis in a.m.Marland Kitchen Reports watery loose stool yesterday 1 episode. Denies abdominal pain.  Objective: Vital signs in last 24 hours: Temp:  [98.5 F (36.9 C)-98.6 F (37 C)] 98.5 F (36.9 C) (08/20 0517) Pulse Rate:  [71-76] 71 (08/20 0517) Resp:  [16-20] 16 (08/20 0517) BP: (135-139)/(65-101) 137/91 (08/20 0517) SpO2:  [100 %] 100 % (08/20 0517) Weight change:  Last BM Date: 03/12/18  FB:PZWCH, nontoxic GENERAL:mild pallor, no icterus ABDOMEN:soft, nondistended, nontender, normoactive bowel sounds EXTREMITIES:no edema, no deformity  Lab Results: Results for orders placed or performed during the hospital encounter of 03/09/18 (from the past 48 hour(s))  Basic metabolic panel     Status: Abnormal   Collection Time: 03/12/18  8:06 AM  Result Value Ref Range   Sodium 141 135 - 145 mmol/L   Potassium 3.5 3.5 - 5.1 mmol/L   Chloride 110 98 - 111 mmol/L   CO2 25 22 - 32 mmol/L   Glucose, Bld 89 70 - 99 mg/dL   BUN <5 (L) 6 - 20 mg/dL   Creatinine, Ser 1.43 (H) 0.44 - 1.00 mg/dL   Calcium 7.9 (L) 8.9 - 10.3 mg/dL   GFR calc non Af Amer 50 (L) >60 mL/min   GFR calc Af Amer 58 (L) >60 mL/min    Comment: (NOTE) The eGFR has been calculated using the CKD EPI equation. This calculation has not been validated in all clinical situations. eGFR's persistently <60 mL/min signify possible Chronic Kidney Disease.    Anion gap 6 5 - 15    Comment: Performed at [facility] [address] [place], [place] [address]  Basic metabolic panel     Status: Abnormal   Collection Time: 03/13/18  5:35 AM  Result Value Ref Range   Sodium 142 135 - 145 mmol/L   Potassium 3.3 (L) 3.5 - 5.1 mmol/L   Chloride 105 98 - 111 mmol/L   CO2 25 22 - 32 mmol/L   Glucose, Bld 85 70 - 99 mg/dL   BUN <5 (L) 6 - 20 mg/dL   Creatinine, Ser 1.29 (H) 0.44 - 1.00 mg/dL    Calcium 8.2 (L) 8.9 - 10.3 mg/dL   GFR calc non Af Amer 57 (L) >60 mL/min   GFR calc Af Amer >60 >60 mL/min    Comment: (NOTE) The eGFR has been calculated using the CKD EPI equation. This calculation has not been validated in all clinical situations. eGFR's persistently <60 mL/min signify possible Chronic Kidney Disease.    Anion gap 12 5 - 15    Comment: Performed at [facility] [address] [place], [place] [address]    Studies/Results: No results found.  Medications: I have reviewed the patient's current medications.  Assessment: 1.Complex fluid 3.7X 5.2 cm collection centered in the lesser sac causing mass effect upon pancreatic body and tail junction, no evidence of pancreatic necrosis,compatible with complicated pancreatitis and developing pseudocyst as per MRCP/MRI from 03/10/18 This is associated with nonocclusive thrombus of splenic vein.  2.32.1 cm ill-defined complex fluid and edema within operative. At cholecystectomy site with no choledocholithiasis,Likely postoperative seroma, biloma and less likely abscess  3. Hepatic steatosis  4.bilateral renal atrophy, worse on the left,base urine/creatinine/GFR of less than 5/1.29/57  Plan: Recommend 3 day calorie count, if patient does not meet nutritional needs, recommend IR guided placement of nasojejunal tube for  feeding purposes. In about 2-3 weeks patient needs repeat imaging,to reevaluate pancreatic pseudocyst, for possible drainage if the surrounding wall has matured enough. Dosage of Creon increased to 72,000 units per meal and 36,000 unit capsule per snack. Patient on Phenergan 12.5 mg IV every 6 hours when necessary injections for nausea, will add zofran 4 mg IV every 4 hours if needed.   Ronnette Juniper 03/13/2018, 11:12 AM   Pager [phone] If no answer or after 5 PM call [phone]

## 2018-03-13 NOTE — Progress Notes (Signed)
Asked patient throughout the day if she wanted to open the blinds up and let some daylight in. Each time she said no. She only took in 15% of her lunch broth and maybe a cup of water throughout the day with her medications. She vomited once with the potasium.  Awaiting dinner trays calorie count started at lunch.

## 2018-03-13 NOTE — Progress Notes (Signed)
PROGRESS NOTE    Sharon Duncan  WUJ:[id number] DOB: [birth year] DOA: 03/09/2018 PCP: System, Pcp Not In   Brief Narrative: Patient is a 27 year old female with past medical history of acute pancreatitis, persistent nausea and vomiting who presented to the emergency department with complaints of nausea and vomiting which has been going on for a while.  Patient was admitted here on July for the management of pancreatitis, pancreatic pseudocyst.  She was diagnosed with this after her pregnancy.  CT abdomen/pelvis done on this admission shows circumcised collection arising from the body and tail of the pancreas concerning for abscess/leak/necrosis.  MRI more suggestive of developing pseudocyst.  GI and general surgery were following.  No plan for any intervention.  Currently being managed for persistent nausea.     Assessment & Plan:   Active Problems:   Nausea & vomiting   Obesity, Class III, BMI 40-49.9 (morbid obesity) (HCC)   CKD (chronic kidney disease), stage III (HCC)  Pancreatic pseudocyst: MRI more suggestive of developing pseudocyst.  No choledocholithiasis. GI and general surgery evaluated the patient  .No plan for intervention.  Mildly elevated lipase.  Other liver enzymes normal.  Might take 6 to 8 weeks for resolution.  She is on pancreatic supplements at home.  We will continue that.  She will follow-up with GI/general surgery as an outpatient. Discussed with husband on phone who was very concerned about the plan of conservative management.  General surgery and gastroenterology discussed with the husband again.  There is no plan for any surgical intervention.  GI following and recommended to monitor her.  Will request for dietitian to see her to boost her appetite.  If her oral intake continues to poor, GI recommending NG tube placement for feeding.  The plan is to wait for 6 to 8 weeks until the pseudocyst matures and then she can have the endoscopic procedure for stent placement or have  surgical procedure to remove the pseudocyst as an alternative. Currently GI is recommending 3-day calorie count.  If patient does not meet nutritional needs, recommending IR guided placement of nasojejunal tube for feeding purposes.  Recommending repeat imaging in 2 to 3 weeks to reevaluate the pancreatic pseudocyst for possible drainage if the surrounding wall matures. Nutrition following.  Continue Creon.  Nausea/vomiting: Complains of persistent nausea but both have improved.  Vomited this morning though.  Started on Phenergan for nausea.  Hypernatremia: Much improved with  half-normal saline.  Hypokalemia: Supplemented with potassium.  Stage III CKD: Her baseline creatinine is around 1.3.  She has also history of frequent UTI.  Creatinine increased from baseline but is improving .  We will continue IV fluids.  Avoid nephrotoxins.  UA not suggestive of UTI.   DVT prophylaxis: Lovenox Code Status: Full Family Communication: Discussed with husband at bedside yesterday Disposition Plan: Home after resolution of nausea, improvement in the oral intake.   Consultants: GI, surgery  Procedures: None  Antimicrobials: None  Subjective: Patient seen and examined the bedside this morning.  Remains hemodynamically stable.  Complaint of vomiting once this morning.  Nausea has much improved.  Denies any abdominal pain.  Objective: Vitals:   03/12/18 1300 03/12/18 2050 03/13/18 0517 03/13/18 1340  BP: 135/65 (!) 139/101 (!) 137/91 123/83  Pulse: 76 75 71 72  Resp: 20 18 16 17   Temp:  98.6 F (37 C) 98.5 F (36.9 C) 97.8 F (36.6 C)  TempSrc:  Oral Oral Oral  SpO2: 100% 100% 100% 100%  Weight:  Height:        Intake/Output Summary (Last 24 hours) at 03/13/2018 1359 Last data filed at 03/13/2018 1055 Gross per 24 hour  Intake 801.84 ml  Output -  Net 801.84 ml   Filed Weights   03/09/18 2151  Weight: 104.3 kg    Examination:  General exam: Appears calm and comfortable  ,Not obese distress,oHEENT:PERRL,Oral mucosa moist, Ear/Nose normal on gross exam Respiratory system: Bilateral equal air entry, normal vesicular breath sounds, no wheezes or crackles  Cardiovascular system: S1 & S2 heard, RRR. No JVD, murmurs, rubs, gallops or clicks. Gastrointestinal system: Abdomen is nondistended, soft and nontender. No organomegaly or masses felt. Normal bowel sounds heard. Central nervous system: Alert and oriented. No focal neurological deficits. Extremities: No edema, no clubbing ,no cyanosis, distal peripheral pulses palpable. Skin: No rashes, lesions or ulcers,no icterus ,no pallor MSK: Normal muscle bulk,tone ,power Psychiatry: Judgement and insight appear normal. Mood & affect appropriate.       Data Reviewed: I have personally reviewed following labs and imaging studies  CBC: Recent Labs  Lab 03/09/18 2220 03/11/18 0419  WBC 10.3 5.2  HGB 12.0 9.3*  HCT 37.9 30.0*  MCV 86.1 88.8  PLT 229 176   Basic Metabolic Panel: Recent Labs  Lab 03/09/18 2220 03/11/18 0419 03/12/18 0806 03/13/18 0535  NA 141 146* 141 142  K 3.3* 3.9 3.5 3.3*  CL 104 115* 110 105  CO2 23 25 25 25   GLUCOSE 145* 102* 89 85  BUN 10 6 <5* <5*  CREATININE 1.90* 1.75* 1.43* 1.29*  CALCIUM 9.1 8.0* 7.9* 8.2*   GFR: Estimated Creatinine Clearance: 78.4 mL/min (A) (by C-G formula based on SCr of 1.29 mg/dL (H)). Liver Function Tests: Recent Labs  Lab 03/09/18 2220 03/11/18 0419  AST 35 21  ALT 37 26  ALKPHOS 118 82  BILITOT 1.8* 1.0  PROT 8.1 6.1*  ALBUMIN 3.7 2.8*   Recent Labs  Lab 03/09/18 2220  LIPASE 39   No results for input(s): AMMONIA in the last 168 hours. Coagulation Profile: No results for input(s): INR, PROTIME in the last 168 hours. Cardiac Enzymes: No results for input(s): CKTOTAL, CKMB, CKMBINDEX, TROPONINI in the last 168 hours. BNP (last 3 results) No results for input(s): PROBNP in the last 8760 hours. HbA1C: No results for input(s):  HGBA1C in the last 72 hours. CBG: No results for input(s): GLUCAP in the last 168 hours. Lipid Profile: No results for input(s): CHOL, HDL, LDLCALC, TRIG, CHOLHDL, LDLDIRECT in the last 72 hours. Thyroid Function Tests: Recent Labs    03/11/18 0419  TSH 1.407   Anemia Panel: No results for input(s): VITAMINB12, FOLATE, FERRITIN, TIBC, IRON, RETICCTPCT in the last 72 hours. Sepsis Labs: No results for input(s): PROCALCITON, LATICACIDVEN in the last 168 hours.  Recent Results (from the past 240 hour(s))  Urine Culture     Status: None   Collection Time: 03/10/18  6:55 PM  Result Value Ref Range Status   Specimen Description URINE, RANDOM  Final   Special Requests NONE  Final   Culture   Final    NO GROWTH Performed at [facility], [address] [place], [place] [address]    Report Status 03/11/2018 FINAL  Final         Radiology Studies: No results found.      Scheduled Meds: . enoxaparin (LOVENOX) injection  40 mg Subcutaneous Q24H  . feeding supplement  1 Container Oral TID BM  . feeding supplement (  PRO-STAT SUGAR FREE 64)  30 mL Oral Daily  . lipase/protease/amylase  72,000 Units Oral TID WC  . mouth rinse  15 mL Mouth Rinse BID   Continuous Infusions: . sodium chloride 75 mL/hr at 03/13/18 0140     LOS: 3 days    Time spent: 25 mins.More than 50% of that time was spent in counseling and/or coordination of care.      Burnadette PopAmrit Adeli Frost, MD [facility] Pager [phone]  If 7PM-7AM, please contact night-coverage [email or web address] Password [email or web address] 03/13/2018, 1:59 PM

## 2018-03-14 LAB — BASIC METABOLIC PANEL
ANION GAP: 10 (ref 5–15)
BUN: 6 mg/dL (ref 6–20)
CALCIUM: 8 mg/dL — AB (ref 8.9–10.3)
CO2: 23 mmol/L (ref 22–32)
Chloride: 108 mmol/L (ref 98–111)
Creatinine, Ser: 1.25 mg/dL — ABNORMAL HIGH (ref 0.44–1.00)
GFR calc Af Amer: >60 mL/min (ref >60)
GFR calc non Af Amer: 59 mL/min — ABNORMAL LOW (ref >60)
GLUCOSE: 77 mg/dL (ref 70–99)
POTASSIUM: 3.6 mmol/L (ref 3.5–5.1)
Sodium: 141 mmol/L (ref 135–145)

## 2018-03-14 LAB — MAGNESIUM: Magnesium: 1.4 mg/dL — ABNORMAL LOW (ref 1.7–2.4)

## 2018-03-14 MED ORDER — METOCLOPRAMIDE HCL 5 MG/ML IJ SOLN
10.0000 mg | Freq: Four times a day (QID) | INTRAMUSCULAR | Status: DC
  Administered 2018-03-14 – 2018-03-15 (×4): 10 mg via INTRAVENOUS
  Filled 2018-03-14 (×4): qty 2

## 2018-03-14 NOTE — Progress Notes (Signed)
[facility] Progress Note  Subjective: The patient was seen today. She complains of nausea and occasional vomiting. She has pancreatic necrosis and developing pseudocyst. In a few weeks she is aware this may need draining in a few weeks if it does not resolve.. Plan would be for a repeat CT or MRI. If the pseudocyst is still present would consult IR at that time for drainage.  Objective: Vital signs in last 24 hours: Temp:  [97.8 F (36.6 C)-98.6 F (37 C)] 98.5 F (36.9 C) (08/21 0526) Pulse Rate:  [60-72] 60 (08/21 0526) Resp:  [17-18] 18 (08/21 0526) BP: (122-126)/(74-83) 122/76 (08/21 0526) SpO2:  [100 %] 100 % (08/21 0526) Weight change:    No distress heart regular rhythm  Abdomen soft nontender   Lab Results: Results for orders placed or performed during the hospital encounter of 03/09/18 (from the past 24 hour(s))  Basic metabolic panel     Status: Abnormal   Collection Time: 03/14/18  4:19 AM  Result Value Ref Range   Sodium 141 135 - 145 mmol/L   Potassium 3.6 3.5 - 5.1 mmol/L   Chloride 108 98 - 111 mmol/L   CO2 23 22 - 32 mmol/L   Glucose, Bld 77 70 - 99 mg/dL   BUN 6 6 - 20 mg/dL   Creatinine, Ser 9.601.25 (H) 0.44 - 1.00 mg/dL   Calcium 8.0 (L) 8.9 - 10.3 mg/dL   GFR calc non Af Amer 59 (L) >60 mL/min   GFR calc Af Amer >60 >60 mL/min   Anion gap 10 5 - 15  Magnesium     Status: Abnormal   Collection Time: 03/14/18  4:19 AM  Result Value Ref Range   Magnesium 1.4 (L) 1.7 - 2.4 mg/dL    Studies/Results: No results found.    Assessment:  PANCREATIC NECROSIS WITH PSEUDOCYST FORMATION   Plan: Continue supportive care  03/14/2018, 11:04 AM  Pager: [phone] If no answer or after 5 PM call [phone]

## 2018-03-14 NOTE — Progress Notes (Signed)
PROGRESS NOTE    Sharon Duncan  JXB:[id number] DOB: 06/24/1991 DOA: 03/09/2018 PCP: System, Pcp Not In   Brief Narrative: Patient is a [age] year old female with past medical history of acute pancreatitis, persistent nausea and vomiting who presented to the emergency department with complaints of nausea and vomiting which has been going on for a while.  Patient was admitted here on July for the management of pancreatitis, pancreatic pseudocyst.  She was diagnosed with this after her pregnancy.  CT abdomen/pelvis done on this admission shows circumcised collection arising from the body and tail of the pancreas concerning for abscess/leak/necrosis.  MRI more suggestive of developing pseudocyst.  GI and general surgery were following.  No plan for any intervention.  Currently being managed for persistent nausea.  She is also on 3-day calorie count.   Assessment & Plan:   Active Problems:   Nausea & vomiting   Obesity, Class III, BMI 40-49.9 (morbid obesity) (HCC)   CKD (chronic kidney disease), stage III (HCC)  Pancreatic pseudocyst: MRI  suggestive of developing pseudocyst.  No choledocholithiasis. GI and general surgery evaluated the patient  .No plan for intervention.  Mildly elevated lipase.  Other liver enzymes normal.  Might take 6 to 8 weeks for resolution.  She is on pancreatic supplements at home.  We will continue that.  GI following and recommended to continue supportive care.  Dietitian following.Currently GI is recommending 3-day calorie count.  If patient does not meet nutritional needs, recommending IR guided placement of nasojejunal tube for feeding purposes.  Recommending repeat imaging in 2 to 3 weeks to reevaluate the pancreatic pseudocyst for possible drainage if the surrounding wall matures. Nutrition following.  Continue Creon. Patient continues to have poor oral intake.  She just completed 15% of her meals.    Nausea/vomiting: Continues to complain of nausea.  Had a vomiting  episode once.  Will add Reglan today.Also on   Zofran and Phenergan  Hypernatremia: Much improved with  half-normal saline.  Hypokalemia: Supplemented with potassium.  Stage III CKD: Her baseline creatinine is around 1.3.  She has also history of frequent UTI.  Creatinine increased from baseline but is now close to her baseline.  We will continue IV fluids.  Avoid nephrotoxins.  UA not suggestive of UTI.   DVT prophylaxis: Lovenox Code Status: Full Family Communication: None present at the bedside  disposition Plan: Home after resolution of nausea, improvement in the oral intake.   Consultants: GI, surgery  Procedures: None  Antimicrobials: None  Subjective: Patient seen and examined at the bedside this morning.  Continues to complain of nausea.  Vomited once.  Continues to have poor oral intake.   Objective: Vitals:   03/13/18 1340 03/13/18 2203 03/14/18 0526 03/14/18 1316  BP: 123/83 126/74 122/76 137/82  Pulse: 72 68 60 64  Resp: 17 18 18 16   Temp: 97.8 F (36.6 C) 98.6 F (37 C) 98.5 F (36.9 C) 98 F (36.7 C)  TempSrc: Oral Oral Oral Oral  SpO2: 100% 100% 100% 100%  Weight:      Height:        Intake/Output Summary (Last 24 hours) at 03/14/2018 1419 Last data filed at 03/14/2018 1036 Gross per 24 hour  Intake 1541.86 ml  Output -  Net 1541.86 ml   Filed Weights   03/09/18 2151  Weight: 104.3 kg    Examination:  General exam:Morbidly obese, generalized weakness  HEENT:PERRL,Oral mucosa moist, Ear/Nose normal on gross exam Respiratory system: Bilateral equal air entry, normal vesicular  breath sounds, no wheezes or crackles  Cardiovascular system: S1 & S2 heard, RRR. No JVD, murmurs, rubs, gallops or clicks. Gastrointestinal system: Abdomen is nondistended, soft and nontender. No organomegaly or masses felt. Normal bowel sounds heard. Central nervous system: Alert and oriented. No focal neurological deficits. Extremities: No edema, no clubbing ,no  cyanosis, distal peripheral pulses palpable. Skin: No rashes, lesions or ulcers,no icterus ,no pallor Psychiatry: Judgement and insight appear normal. Mood & affect appropriate.    Data Reviewed: I have personally reviewed following labs and imaging studies  CBC: Recent Labs  Lab 03/09/18 2220 03/11/18 0419  WBC 10.3 5.2  HGB 12.0 9.3*  HCT 37.9 30.0*  MCV 86.1 88.8  PLT 229 176   Basic Metabolic Panel: Recent Labs  Lab 03/09/18 2220 03/11/18 0419 03/12/18 0806 03/13/18 0535 03/14/18 0419  NA 141 146* 141 142 141  K 3.3* 3.9 3.5 3.3* 3.6  CL 104 115* 110 105 108  CO2 23 25 25 25 23   GLUCOSE 145* 102* 89 85 77  BUN 10 6 <5* <5* 6  CREATININE 1.90* 1.75* 1.43* 1.29* 1.25*  CALCIUM 9.1 8.0* 7.9* 8.2* 8.0*  MG  --   --   --   --  1.4*   GFR: Estimated Creatinine Clearance: 80.9 mL/min (A) (by C-G formula based on SCr of 1.25 mg/dL (H)). Liver Function Tests: Recent Labs  Lab 03/09/18 2220 03/11/18 0419  AST 35 21  ALT 37 26  ALKPHOS 118 82  BILITOT 1.8* 1.0  PROT 8.1 6.1*  ALBUMIN 3.7 2.8*   Recent Labs  Lab 03/09/18 2220  LIPASE 39   No results for input(s): AMMONIA in the last 168 hours. Coagulation Profile: No results for input(s): INR, PROTIME in the last 168 hours. Cardiac Enzymes: No results for input(s): CKTOTAL, CKMB, CKMBINDEX, TROPONINI in the last 168 hours. BNP (last 3 results) No results for input(s): PROBNP in the last 8760 hours. HbA1C: No results for input(s): HGBA1C in the last 72 hours. CBG: No results for input(s): GLUCAP in the last 168 hours. Lipid Profile: No results for input(s): CHOL, HDL, LDLCALC, TRIG, CHOLHDL, LDLDIRECT in the last 72 hours. Thyroid Function Tests: No results for input(s): TSH, T4TOTAL, FREET4, T3FREE, THYROIDAB in the last 72 hours. Anemia Panel: No results for input(s): VITAMINB12, FOLATE, FERRITIN, TIBC, IRON, RETICCTPCT in the last 72 hours. Sepsis Labs: No results for input(s): PROCALCITON,  LATICACIDVEN in the last 168 hours.  Recent Results (from the past 240 hour(s))  Urine Culture     Status: None   Collection Time: 03/10/18  6:55 PM  Result Value Ref Range Status   Specimen Description URINE, RANDOM  Final   Special Requests NONE  Final   Culture   Final    NO GROWTH Performed at [facility], [address] [place], [place] [address]    Report Status 03/11/2018 FINAL  Final         Radiology Studies: No results found.      Scheduled Meds: . enoxaparin (LOVENOX) injection  40 mg Subcutaneous Q24H  . feeding supplement  1 Container Oral TID BM  . feeding supplement (PRO-STAT SUGAR FREE 64)  30 mL Oral Daily  . lipase/protease/amylase  72,000 Units Oral TID WC  . mouth rinse  15 mL Mouth Rinse BID   Continuous Infusions: . sodium chloride 75 mL/hr at 03/14/18 0225     LOS: 4 days    Time spent: 25 mins.More than 50% of that time  was spent in counseling and/or coordination of care.      Burnadette Pop, MD [facility] Pager [phone]  If 7PM-7AM, please contact night-coverage [email or web address] Password [facility] 03/14/2018, 2:19 PM

## 2018-03-15 ENCOUNTER — Inpatient Hospital Stay (HOSPITAL_COMMUNITY): Payer: Medicaid Other

## 2018-03-15 LAB — CBC WITH DIFFERENTIAL/PLATELET
ABS IMMATURE GRANULOCYTES: 0.1 10*3/uL (ref 0.0–0.1)
Basophils Absolute: 0 10*3/uL (ref 0.0–0.1)
Basophils Relative: 0 %
EOS PCT: 0 %
Eosinophils Absolute: 0 10*3/uL (ref 0.0–0.7)
HEMATOCRIT: 28.7 % — AB (ref 36.0–46.0)
Hemoglobin: 9.2 g/dL — ABNORMAL LOW (ref 12.0–15.0)
Immature Granulocytes: 1 %
LYMPHS ABS: 0.7 10*3/uL (ref 0.7–4.0)
LYMPHS PCT: 9 %
MCH: 27.3 pg (ref 26.0–34.0)
MCHC: 32.1 g/dL (ref 30.0–36.0)
MCV: 85.2 fL (ref 78.0–100.0)
Monocytes Absolute: 0.4 10*3/uL (ref 0.1–1.0)
Monocytes Relative: 5 %
NEUTROS PCT: 85 %
Neutro Abs: 6.5 10*3/uL (ref 1.7–7.7)
Platelets: 114 10*3/uL — ABNORMAL LOW (ref 150–400)
RBC: 3.37 MIL/uL — AB (ref 3.87–5.11)
RDW: 16.1 % — ABNORMAL HIGH (ref 11.5–15.5)
WBC: 7.6 10*3/uL (ref 4.0–10.5)

## 2018-03-15 LAB — BASIC METABOLIC PANEL
Anion gap: 11 (ref 5–15)
BUN: 6 mg/dL (ref 6–20)
CHLORIDE: 106 mmol/L (ref 98–111)
CO2: 24 mmol/L (ref 22–32)
Calcium: 8.1 mg/dL — ABNORMAL LOW (ref 8.9–10.3)
Creatinine, Ser: 1.41 mg/dL — ABNORMAL HIGH (ref 0.44–1.00)
GFR calc non Af Amer: 51 mL/min — ABNORMAL LOW (ref >60)
GFR, EST AFRICAN AMERICAN: 59 mL/min — AB (ref >60)
Glucose, Bld: 107 mg/dL — ABNORMAL HIGH (ref 70–99)
POTASSIUM: 3.2 mmol/L — AB (ref 3.5–5.1)
Sodium: 141 mmol/L (ref 135–145)

## 2018-03-15 LAB — URINALYSIS, ROUTINE W REFLEX MICROSCOPIC
Bilirubin Urine: NEGATIVE
Glucose, UA: NEGATIVE mg/dL
Ketones, ur: NEGATIVE mg/dL
Nitrite: NEGATIVE
Protein, ur: 30 mg/dL — AB
SPECIFIC GRAVITY, URINE: 1.008 (ref 1.005–1.030)
pH: 6 (ref 5.0–8.0)

## 2018-03-15 MED ORDER — POTASSIUM CHLORIDE CRYS ER 20 MEQ PO TBCR: 40.0000 meq | EXTENDED_RELEASE_TABLET | Freq: Two times a day (BID) | ORAL | Status: DC

## 2018-03-15 MED ORDER — SODIUM CHLORIDE 0.9 % IV SOLN
1.0000 g | Freq: Three times a day (TID) | INTRAVENOUS | Status: DC
  Administered 2018-03-16 – 2018-03-17 (×5): 1 g via INTRAVENOUS
  Filled 2018-03-15 (×6): qty 1

## 2018-03-15 MED ORDER — VANCOMYCIN HCL IN DEXTROSE 1-5 GM/200ML-% IV SOLN
1000.0000 mg | Freq: Once | INTRAVENOUS | Status: AC
  Administered 2018-03-15: 1000 mg via INTRAVENOUS
  Filled 2018-03-15 (×2): qty 200

## 2018-03-15 MED ORDER — POTASSIUM CHLORIDE CRYS ER 20 MEQ PO TBCR
40.0000 meq | EXTENDED_RELEASE_TABLET | Freq: Two times a day (BID) | ORAL | Status: AC
  Administered 2018-03-15: 40 meq via ORAL
  Filled 2018-03-15 (×2): qty 2

## 2018-03-15 MED ORDER — PIPERACILLIN-TAZOBACTAM 4.5 G IVPB
4.5000 g | Freq: Once | INTRAVENOUS | Status: AC
  Administered 2018-03-15: 4.5 g via INTRAVENOUS
  Filled 2018-03-15 (×2): qty 100

## 2018-03-15 MED ORDER — ALPRAZOLAM 0.5 MG PO TABS
0.5000 mg | ORAL_TABLET | Freq: Three times a day (TID) | ORAL | Status: DC | PRN
  Administered 2018-03-15 – 2018-03-16 (×2): 0.5 mg via ORAL
  Filled 2018-03-15 (×2): qty 1

## 2018-03-15 NOTE — Progress Notes (Signed)
Informed Dr. Claire ShownAdikhari patient is more relaxed after xanax but her HR is staying between 120's to 150's. MD to come see patient.

## 2018-03-15 NOTE — Progress Notes (Signed)
Dr. Renford DillsAdhikari at bedside Patients temp is 102.5 MD placing ordered for Blood cultures and antibiotics.

## 2018-03-15 NOTE — Progress Notes (Signed)
Informed Dr. Renford DillsAdhikari that patients HR spiked to th 160's and patient was shaking in bed BP 124/89 Temp 98.1 patient appears to be having anxiety attack.

## 2018-03-15 NOTE — Progress Notes (Addendum)
Nutrition Follow-up  DOCUMENTATION CODES:   Obesity unspecified  INTERVENTION:   Pt continues to demonstrate inadequate PO intake despite attempts at oral nutrition supplementation and education regarding importance of adequate PO intake.  Recommend proceeding now with placement of post-pyloric feeding tube (pt not tolerating an oral diet with persistent N/V) rather than waiting until end of calorie count as pt's PO intake is unlikely to improve and pt is at risk for acute malnutrition.  Addendum: spoke with GI doctor who agreed with post-pyloric Cortrak placement by Cortrak team tomorrow and requested RD place consult.  - Boost Breeze po TID, each supplement provides 250 kcal and 9 grams of protein (pt has been refusing)  - 30 ml Pro-stat once daily, each provides 100 kcal and 15 grams of protein (pt has been refusing)  - Continue 3-day calorie count per GI  Tube feeding recommendations: initiate Osmolite 1.2 @ 20 ml/hr and increase by 10 ml q 8 hours until goal rate of 65 ml/hr is reached.  Recommended tube feeding regimen provides 1872 kcal, 87 grams of protein, and 1279 ml of H2O.  NUTRITION DIAGNOSIS:   Inadequate oral intake related to altered GI function, nausea, vomiting as evidenced by per patient/family report.  Ongoing  GOAL:   Patient will meet greater than or equal to 90% of their needs  Unmet  MONITOR:   PO intake, Supplement acceptance, Weight trends, Labs  REASON FOR ASSESSMENT:   Consult Assessment of nutrition requirement/status  ASSESSMENT:   Patient with PMH significant for CKD III, depression, frequent UTI, pancreatitis due to common bile duct stone s/p cholecystecomy, and recent vaginal baby delivery (01/26/18). Presents this admission with ongoing nausea/vomting with subsequent wt loss. Pt admitted for likely developing pancreatic pseudocyst.  Per GI note on 8/19, considering placement of a post-pyloric feeding tube to provide adequate nutrition  over the next 2-3 weeks. Noted 3-day calorie count ordered on 8/20 and started on 8/21. No RD consult received to initiate calorie count, however RD reviewed meal slips available in folder on pt's door and returned slips to folder. See below:  8/20: Breakfast: no meal slip in folder Lunch: 3 kcal, 0 grams protein Dinner: no meal slip in folder Supplements: 2 Boost Breeze oral nutrition supplements provided, unsure if pt consumed 100% but will include in daily total  Total intake: 503 kcal (29% of minimum estimated needs)  18 grams protein (21% of minimum estimated needs)  8/21: Breakfast: no meal completion noted on slip Lunch: no meal slip in folder Dinner: 75 kcal, 0.5 grams protein Supplements: pt refused all oral nutrition supplements  Total intake: 75 kcal (4% of minimum estimated needs)  0.5 grams protein (1% of minimum estimated needs)  8/22: Breakfast: 24 kcal, 0 grams protein Supplements: pt has refused all oral nutrition supplements so far today  Discussed RD's recommendation for placement of Cortrak feeding tube today rather than waiting for resolution of calorie count with attending MD. Attempted to discuss with GI MD but have been unable to reach.  Spoke with RN who reports pt had an "anxiety attack" this morning when saline bag was hung at bedside. RN reports pt has been refusing all oral nutrition supplements ordered as well as pancreatic enzymes. RN continues to provide education regarding importance of supplement and enzyme consumption.  Spoke with pt at bedside who reports she is not doing well. Discussed prospect of feeding tube placement. Pt with many questions about placement. All questions answered.  Meal Completion: 0-25%  Medications reviewed and  include: Boost Breeze TID - pt has been refusing, 30 ml Pro-stat daily - pt has been refusing, 72,000 units Creon TID with means and 36,000 Creon with snacks - pt has been refusion, 10 mg Reglan QID  Labs reviewed:  potassium 3.2 (L), creatinine 1.41 (H), magnesium 1.4 (L), hemoglobin 9.3 (L), HCT 30.0 (L)  Diet Order:   Diet Order            Diet clear liquid Room service appropriate? Yes; Fluid consistency: Thin  Diet effective now              EDUCATION NEEDS:   Education needs have been addressed  Skin:  Skin Assessment: Reviewed RN Assessment  Last BM:  03/13/18  Height:   Ht Readings from Last 1 Encounters:  03/10/18 5\' 4"  (1.626 m)    Weight:   Wt Readings from Last 1 Encounters:  03/09/18 104.3 kg    Ideal Body Weight:  54.5 kg  BMI:  Body mass index is 39.48 kg/m.  Estimated Nutritional Needs:   Kcal:  1750-1950 kcal  Protein:  85-95 grams   Fluid:  >/= 1.7 L/day    Sharon ReadingKate Duncan Sharon Melgarejo, MS, RD, LDN Pager: [phone] Weekend/After Hours: [phone]

## 2018-03-15 NOTE — Progress Notes (Signed)
Informed Dr. Renford DillsAdhikari that patients HR is back up to 150's and patient appears to be having another panic attack. Informed MD patient is pacing in room and states she can't get comfortable but denies pain anywhere. VSS. MD to order xanax.

## 2018-03-15 NOTE — Progress Notes (Signed)
I spoke with the patient's dietitian today.  The patient has clearly an adequate oral nutritional intake, and is now agreeable to placement of the feeding tube, which will be done tomorrow by the dietitian.  I explained to the patient that the resolution of her pancreatitis associated pain, and maturation of her pseudocyst, will take time.  In the meantime, enteral nutrition support will hopefully help carry her along.  We will follow the patient every several days to assess tolerance of tube feeding; please call us in the meantime if more immediate input from us is desired.  Florencia Reasonsobert V. Chauntelle Azpeitia, M.D. Pager [phone] If no answer or after 5 PM call [phone]

## 2018-03-15 NOTE — Progress Notes (Addendum)
PROGRESS NOTE    Sharon Duncan  WUJ:[id number] DOB: Jan 05, 1991 DOA: 03/09/2018 PCP: System, Pcp Not In   Brief Narrative: Patient is a 27 year old female with past medical history of acute pancreatitis, persistent nausea and vomiting who presented to the emergency department with complaints of nausea and vomiting which has been going on for a while.  Patient was admitted here on July for the management of pancreatitis, pancreatic pseudocyst.  She was diagnosed with this after her pregnancy.  CT abdomen/pelvis done on this admission shows circumcised collection arising from the body and tail of the pancreas concerning for abscess/leak/necrosis.  MRI more suggestive of developing pseudocyst.  GI and general surgery were following.  No plan for any intervention.  Currently being managed for persistent nausea.  She is also on 3-day calorie count.   Assessment & Plan:   Active Problems:   Nausea & vomiting   Obesity, Class III, BMI 40-49.9 (morbid obesity) (HCC)   CKD (chronic kidney disease), stage III (HCC)  Pancreatic pseudocyst: MRI  suggestive of developing pseudocyst.  No choledocholithiasis. GI and general surgery evaluated the patient  .No plan for intervention.  Mildly elevated lipase.  Other liver enzymes normal.  Might take 6 to 8 weeks for resolution.  She is on pancreatic supplements at home.  We will continue that. Recommending repeat imaging in 2 to 3 weeks to reevaluate the pancreatic pseudocyst for possible drainage if the surrounding wall matures.    Poor oral intake: Secondary to persistent nausea and vomiting.  GI following and recommended to continue supportive care.  Dietitian following.GI recommended 3-day calorie count. Patient continues to have poor oral intake.Plan for post-pyloric Cortrak placement by Cortrak team tomorrow and requested RD place consult for feeding purposes.    Fever/chills: New problem.  Unknown etiology.  Will check UA, CBC, blood cultures, chest x-ray.   Patient denies any abdominal pain or dysuria.  Denies any shortness of breath or chest pain.  We will give her a dose of vancomycin and Zosyn  Sinus tachycardia/QT prolongation: EKG shows sinus tachycardia.  Most likely acid with fever.  Also showed critical QT prolongation.  Will discontinue Zofran and Reglan and just continue Phenergan for now.  Nausea/vomiting: Her nausea and vomiting have improved today.  On reglan.Also on   Zofran and Phenergan  Hypernatremia: Much improved with  half-normal saline.  Hypokalemia: Supplemented with potassium.  Stage III CKD: Her baseline creatinine is around 1.3.  She has also history of frequent UTI.  Creatinine increased from baseline but is now close to her baseline.  We will continue IV fluids.  Avoid nephrotoxins.  UA not suggestive of UTI.  Anxiety/depression: Requested for psychiatric evaluation today.   DVT prophylaxis: Lovenox Code Status: Full Family Communication: None present at the bedside  disposition Plan: Home after resolution of nausea, improvement in the oral intake.  Planning for placement of Cortrak tube   Consultants: GI, surgery  Procedures: None  Antimicrobials: None  Subjective: Patient seen and examined at the bedside this morning.  Vomiting improved.  But patient is extremely anxious.  She was tachycardic.  Noted to have fever and chills this afternoon.  Denies any abdominal pain.   Objective: Vitals:   03/15/18 0436 03/15/18 0915 03/15/18 1250 03/15/18 1355  BP: (!) 145/82 124/89 138/77   Pulse: (!) 107  (!) 118   Resp: 18 (!) 22 20   Temp: 99.7 F (37.6 C) 98.1 F (36.7 C) 99.8 F (37.7 C) (!) 102.5 F (39.2 C)  TempSrc:  Oral Oral   SpO2: 100% 100% 100%   Weight:      Height:        Intake/Output Summary (Last 24 hours) at 03/15/2018 1501 Last data filed at 03/15/2018 1421 Gross per 24 hour  Intake 2365.26 ml  Output 20 ml  Net 2345.26 ml   Filed Weights   03/09/18 2151  Weight: 104.3 kg     Examination:  General exam:Morbidly obese, generalized weakness  HEENT:PERRL,Oral mucosa moist, Ear/Nose normal on gross exam Respiratory system: Bilateral equal air entry, normal vesicular breath sounds, no wheezes or crackles  Cardiovascular system: Sinus tachycardia, No JVD, murmurs, rubs, gallops or clicks. Gastrointestinal system: Abdomen is nondistended, soft and nontender. No organomegaly or masses felt. Normal bowel sounds heard. Central nervous system: Alert and oriented. No focal neurological deficits. Extremities: No edema, no clubbing ,no cyanosis, distal peripheral pulses palpable. Skin: No rashes, lesions or ulcers,no icterus ,no pallor Psychiatry: Judgement and insight appear normal. Mood & affect appropriate.    Data Reviewed: I have personally reviewed following labs and imaging studies  CBC: Recent Labs  Lab 03/09/18 2220 03/11/18 0419  WBC 10.3 5.2  HGB 12.0 9.3*  HCT 37.9 30.0*  MCV 86.1 88.8  PLT 229 176   Basic Metabolic Panel: Recent Labs  Lab 03/11/18 0419 03/12/18 0806 03/13/18 0535 03/14/18 0419 03/15/18 0232  NA 146* 141 142 141 141  K 3.9 3.5 3.3* 3.6 3.2*  CL 115* 110 105 108 106  CO2 25 25 25 23 24   GLUCOSE 102* 89 85 77 107*  BUN 6 <5* <5* 6 6  CREATININE 1.75* 1.43* 1.29* 1.25* 1.41*  CALCIUM 8.0* 7.9* 8.2* 8.0* 8.1*  MG  --   --   --  1.4*  --    GFR: Estimated Creatinine Clearance: 71.7 mL/min (A) (by C-G formula based on SCr of 1.41 mg/dL (H)). Liver Function Tests: Recent Labs  Lab 03/09/18 2220 03/11/18 0419  AST 35 21  ALT 37 26  ALKPHOS 118 82  BILITOT 1.8* 1.0  PROT 8.1 6.1*  ALBUMIN 3.7 2.8*   Recent Labs  Lab 03/09/18 2220  LIPASE 39   No results for input(s): AMMONIA in the last 168 hours. Coagulation Profile: No results for input(s): INR, PROTIME in the last 168 hours. Cardiac Enzymes: No results for input(s): CKTOTAL, CKMB, CKMBINDEX, TROPONINI in the last 168 hours. BNP (last 3 results) No results  for input(s): PROBNP in the last 8760 hours. HbA1C: No results for input(s): HGBA1C in the last 72 hours. CBG: No results for input(s): GLUCAP in the last 168 hours. Lipid Profile: No results for input(s): CHOL, HDL, LDLCALC, TRIG, CHOLHDL, LDLDIRECT in the last 72 hours. Thyroid Function Tests: No results for input(s): TSH, T4TOTAL, FREET4, T3FREE, THYROIDAB in the last 72 hours. Anemia Panel: No results for input(s): VITAMINB12, FOLATE, FERRITIN, TIBC, IRON, RETICCTPCT in the last 72 hours. Sepsis Labs: No results for input(s): PROCALCITON, LATICACIDVEN in the last 168 hours.  Recent Results (from the past 240 hour(s))  Urine Culture     Status: None   Collection Time: 03/10/18  6:55 PM  Result Value Ref Range Status   Specimen Description URINE, RANDOM  Final   Special Requests NONE  Final   Culture   Final    NO GROWTH Performed at [facility], [address] [place], [place] [address]    Report Status 03/11/2018 FINAL  Final         Radiology Studies:  No results found.      Scheduled Meds: . enoxaparin (LOVENOX) injection  40 mg Subcutaneous Q24H  . feeding supplement  1 Container Oral TID BM  . feeding supplement (PRO-STAT SUGAR FREE 64)  30 mL Oral Daily  . lipase/protease/amylase  72,000 Units Oral TID WC  . mouth rinse  15 mL Mouth Rinse BID  . metoCLOPramide (REGLAN) injection  10 mg Intravenous Q6H  . potassium chloride  40 mEq Oral BID   Continuous Infusions: . sodium chloride 75 mL/hr at 03/15/18 0853  . piperacillin-tazobactam (ZOSYN)  IV    . vancomycin       LOS: 5 days    Time spent: 25 mins.More than 50% of that time was spent in counseling and/or coordination of care.      Burnadette Pop, MD [facility] Pager [phone]  If 7PM-7AM, please contact night-coverage [email or web address] Password [facility] 03/15/2018, 3:01 PM

## 2018-03-16 ENCOUNTER — Inpatient Hospital Stay (HOSPITAL_COMMUNITY): Payer: Medicaid Other

## 2018-03-16 DIAGNOSIS — N183 Chronic kidney disease, stage 3 (moderate): Secondary | ICD-10-CM

## 2018-03-16 DIAGNOSIS — N390 Urinary tract infection, site not specified: Secondary | ICD-10-CM

## 2018-03-16 DIAGNOSIS — K863 Pseudocyst of pancreas: Secondary | ICD-10-CM

## 2018-03-16 DIAGNOSIS — E43 Unspecified severe protein-calorie malnutrition: Secondary | ICD-10-CM

## 2018-03-16 DIAGNOSIS — F4321 Adjustment disorder with depressed mood: Secondary | ICD-10-CM

## 2018-03-16 LAB — CBC WITH DIFFERENTIAL/PLATELET
BASOS PCT: 0 %
Basophils Absolute: 0 10*3/uL (ref 0.0–0.1)
EOS ABS: 0.1 10*3/uL (ref 0.0–0.7)
Eosinophils Relative: 1 %
HCT: 28 % — ABNORMAL LOW (ref 36.0–46.0)
HEMOGLOBIN: 9.1 g/dL — AB (ref 12.0–15.0)
LYMPHS PCT: 16 %
Lymphs Abs: 1.2 10*3/uL (ref 0.7–4.0)
MCH: 27.8 pg (ref 26.0–34.0)
MCHC: 32.5 g/dL (ref 30.0–36.0)
MCV: 85.6 fL (ref 78.0–100.0)
Monocytes Absolute: 0.3 10*3/uL (ref 0.1–1.0)
Monocytes Relative: 4 %
NEUTROS PCT: 79 %
Neutro Abs: 5.8 10*3/uL (ref 1.7–7.7)
Platelets: DECREASED 10*3/uL (ref 150–400)
RBC: 3.27 MIL/uL — ABNORMAL LOW (ref 3.87–5.11)
RDW: 16.2 % — ABNORMAL HIGH (ref 11.5–15.5)
WBC MORPHOLOGY: INCREASED
WBC: 7.4 10*3/uL (ref 4.0–10.5)

## 2018-03-16 LAB — BASIC METABOLIC PANEL
ANION GAP: 9 (ref 5–15)
BUN: 7 mg/dL (ref 6–20)
CHLORIDE: 107 mmol/L (ref 98–111)
CO2: 23 mmol/L (ref 22–32)
Calcium: 7.2 mg/dL — ABNORMAL LOW (ref 8.9–10.3)
Creatinine, Ser: 1.42 mg/dL — ABNORMAL HIGH (ref 0.44–1.00)
GFR calc Af Amer: 59 mL/min — ABNORMAL LOW (ref >60)
GFR calc non Af Amer: 51 mL/min — ABNORMAL LOW (ref >60)
Glucose, Bld: 101 mg/dL — ABNORMAL HIGH (ref 70–99)
POTASSIUM: 3.5 mmol/L (ref 3.5–5.1)
Sodium: 139 mmol/L (ref 135–145)

## 2018-03-16 MED ORDER — IOPAMIDOL (ISOVUE-300) INJECTION 61%: 25.0000 mL | Freq: Once | INTRAVENOUS | Status: DC | PRN

## 2018-03-16 MED ORDER — LIDOCAINE VISCOUS HCL 2 % MT SOLN
15.0000 mL | Freq: Once | OROMUCOSAL | Status: DC
  Filled 2018-03-16: qty 15

## 2018-03-16 MED ORDER — METOCLOPRAMIDE HCL 5 MG/ML IJ SOLN
10.0000 mg | Freq: Four times a day (QID) | INTRAMUSCULAR | Status: DC | PRN
  Administered 2018-03-16 – 2018-03-17 (×2): 10 mg via INTRAVENOUS
  Filled 2018-03-16 (×2): qty 2

## 2018-03-16 MED ORDER — LORAZEPAM 2 MG/ML IJ SOLN
1.0000 mg | Freq: Once | INTRAMUSCULAR | Status: AC
  Administered 2018-03-16: 1 mg via INTRAVENOUS
  Filled 2018-03-16: qty 1

## 2018-03-16 NOTE — Progress Notes (Signed)
Patient refused first attempt to take her down to get the tube placed. MD was notified and spoke to her and she agreed to go through with it. The patient went down for the procedure and ended up refusing the tube again. MD notified.
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
## 2018-03-16 NOTE — Progress Notes (Signed)
Cortrak Tube Team Note:  Consult received to place a Cortrak feeding tube.   RD attempted x 3 to place Cortrak feeding tube. Pt began yelling and crying and refused the Cortrak placement, pulling the tube from her nose during the placement. Pt's significant other attempted to comfort and encourage her, but pt continued to refusing, stating, "It is my decision." Pt states she will not get a nasoenteric tube unless she is "completely knocked out."  Discussed with attending MD who asked RD to place consult for IR guided placement with Fluoro. RD to place consult.  Discussed with Fluoro. Pt to go down for placement at 2:00 pm. Let RN know. RD will be available to place bridle.  RD left Cortrak tube and stylet in bag and hung on the wall behind pt's bed.   Earma ReadingKate Jablonski Krystal Teachey, MS, RD, LDN Pager: [phone] Weekend/After Hours: [phone]

## 2018-03-16 NOTE — Progress Notes (Signed)
Patient is stating she wants to leave AMA but still wants a prescription for metoclopramide to take with her. Patient was educated on the protocol of AMA.

## 2018-03-16 NOTE — Progress Notes (Signed)
Nutrition Follow-up  DOCUMENTATION CODES:   Obesity unspecified  INTERVENTION:   Per discussion with GI: - Initiate trial of 30 ml Ensure Enlive PO every 30 minutes while pt is awake. - If pt tolerates well, increase to 45 ml Ensure Enlive every 30 minutes. - If pt tolerates well, increase to 90 ml Ensure Enlive every 1 hour.  - Recommend obtaining new weight as last weight recorded is from 03/09/18  - will d/c Boost Breeze and Pro-stat oral nutrition supplements as pt is refusing  Due to prolonged inadequate oral intake, pt is at risk for acute protein-calorie malnutrition.  NUTRITION DIAGNOSIS:   Inadequate oral intake related to altered GI function, nausea, vomiting as evidenced by per patient/family report.  Ongoing  GOAL:   Patient will meet greater than or equal to 90% of their needs  Unmet  MONITOR:   PO intake, Supplement acceptance, Weight trends, Labs  REASON FOR ASSESSMENT:   Consult Enteral/tube feeding initiation and management  ASSESSMENT:   Patient with PMH significant for CKD III, depression, frequent UTI, pancreatitis due to common bile duct stone s/p cholecystecomy, and recent vaginal baby delivery (01/26/18). Presents this admission with ongoing nausea/vomting with subsequent wt loss. Pt admitted for likely developing pancreatic pseudocyst.  Attempted to post-pyloric Cortrak placement this morning. Pt refused each attempt. See Cortrak team note. IR later attempted to place a nasoenteric tube under fluoro but pt also refused.  Discussed pt with RN. Pt with continued N/V.  Spoke with pt at bedside who reports frustration with current situation. Pt states that she is thinking about leaving the hospital. Discussed new plan for trials of small amounts of Ensure Enlive. Pt is willing to try this as long as she does not have to have a feeding tube placed.  Per discussion with Sharon Duncan, will initiate a trial of small amounts of Ensure Enlive periodically.  RD placed nursing order with verbal read back. See Intervention for details.  If feeding access is obtained and tube feeding is initiated, recommend Vital AF 1.2 @ goal rate 65 ml/hr to provide 1872 kcal and 117 grams of protein. Recommend starting tube feeding at 20 ml/hr and increasing by 10 ml q 8 hours until goal rate is reached.  Meal Completion: 0-25%  Medications reviewed and include: 72,000 units Creon TID with meals - pt refusing, 36,000 units Creon with snacks - pt refusing,   Labs reviewed: creatinine 1.42 (H), hemoglobin 9.1 (L), HCT 28.0 (L)  Diet Order:   Diet Order            Diet clear liquid Room service appropriate? Yes; Fluid consistency: Thin  Diet effective now              EDUCATION NEEDS:   Education needs have been addressed  Skin:  Skin Assessment: Reviewed RN Assessment  Last BM:  03/14/18  Height:   Ht Readings from Last 1 Encounters:  03/10/18 5\' 4"  (1.626 m)    Weight:   Wt Readings from Last 1 Encounters:  03/09/18 104.3 kg    Ideal Body Weight:  54.5 kg  BMI:  Body mass index is 39.48 kg/m.  Estimated Nutritional Needs:   Kcal:  1800-2000  Protein:  100-115 grams  Fluid:  >/= 1.8 L    Sharon ReadingKate Jablonski Jamahl Lemmons, MS, RD, LDN Pager: [phone] Weekend/After Hours: [phone]

## 2018-03-16 NOTE — Progress Notes (Addendum)
PROGRESS NOTE    Sharon Duncan  GNF:[id number] DOB: 08/06/1990 DOA: 03/09/2018 PCP: System, Pcp Not In   Brief Narrative: Patient is a 27 year old female with past medical history of acute pancreatitis, persistent nausea and vomiting who presented to the emergency department with complaints of nausea and vomiting which has been going on for a while.  Patient was admitted here on July for the management of pancreatitis, pancreatic pseudocyst.  She was diagnosed with this after her recent pregnancy.  CT abdomen/pelvis done on this admission shows circumcised collection arising from the body and tail of the pancreas concerning for abscess/leak/necrosis.  MRI was suggestive of developing pseudocyst and less suspicious for abscess.  GI and general surgery were consulted .  No plan for any intervention right now.  Her hospital course has been remarkable for persistent nausea/vomiting/poor oral intake.  She continued to have poor oral intake, GI recommended to put feeding tube which she denied.  Patient cannot be discharged secondary to continued nausea, vomiting and poor oral intake.  Nutrition following very closely.   Assessment & Plan:   Active Problems:   Nausea & vomiting   Obesity, Class III, BMI 40-49.9 (morbid obesity) (HCC)   CKD (chronic kidney disease), stage III (HCC)  Pancreatic pseudocyst: MRI  suggestive of developing pseudocyst.  No choledocholithiasis. GI and general surgery evaluated the patient  .No plan for intervention.  Mildly elevated lipase.  Other liver enzymes normal.  Might take 6 to 8 weeks for resolution.  She is on pancreatic supplements at home.  We will continue that. Recommending repeat imaging in 2 to 3 weeks to reevaluate the pancreatic pseudocyst for possible drainage if the surrounding wall matures.    Poor oral intakeNausea/vomiting: Secondary to persistent nausea and vomiting.  Reported that she lost around 100 pounds at home recently. GI following and recommended  to continue supportive care.  Dietitian following.Patient continues to have poor oral intake.Plan was for post-pyloric Cortrak placement by Cortrak team but she could not tolerate.  We even tried that by interventional radiology but she denied it.  We have notified GI about her refusal of getting feeding tube.  Initiated trial of 30 mL Ensure Enlive orally every 30 minutes. Continue PRN reglan and Phenergan.  Fever/chills: 102 F yesterday.  UA suggestive of urinary tract infection.  Chest x-ray was clear.  Started on aztreonam because of allergy to ceftraixone.  Patient denies any abdominal pain or dysuria.  Denies any shortness of breath or chest pain.  Blood cultures pending.  Please follow-up with the urine culture and sensitivity and de-escalate antibiotics soon.  Sinus tachycardia/QT prolongation: EKG shows sinus tachycardia.  Most likely associated   Fever/anxiety/dehydration.  Tachycardia has improved today.  EKG yesterday showed critical QT prolongation.  Will discontinue Zofran .  QTC much improved today. Continue reglan,Phenergan PRN only.  Hypernatremia: Resolved  Hypokalemia: Supplemented   Stage III CKD: Her baseline creatinine is around 1.3.  She has also history of frequent UTI.  Creatinine increased from baseline but is now close to her baseline.  We will continue IV fluids.  Avoid nephrotoxins.  UA not suggestive of UTI.  Anxiety/depression: Requested for psychiatric evaluation but still pending.   DVT prophylaxis: Lovenox Code Status: Full Family Communication: None present at the bedside  disposition Plan: Home after resolution of nausea, improvement in the oral intake.  Might take several days.   Consultants: GI, surgery  Procedures: None  Antimicrobials: None  Subjective: Patient seen and examined at the bedside this  morning .She still complains of nausea and vomiting.  Denied placement of feeding tube today despite several attempts and  reassurance.  Objective: Vitals:   03/15/18 2000 03/15/18 2204 03/16/18 0513 03/16/18 1530  BP:  120/83 130/89 (!) 146/87  Pulse:  (!) 142 (!) 113 (!) 111  Resp:  18 20 20   Temp: (!) 102.5 F (39.2 C) 99.1 F (37.3 C) 98.9 F (37.2 C) 98.3 F (36.8 C)  TempSrc: Oral Oral Oral Oral  SpO2:  97% 100% 100%  Weight:      Height:        Intake/Output Summary (Last 24 hours) at 03/16/2018 1548 Last data filed at 03/16/2018 1610 Gross per 24 hour  Intake 2072.22 ml  Output -  Net 2072.22 ml   Filed Weights   03/09/18 2151  Weight: 104.3 kg    Examination:  General exam:Morbidly obese, generalized weakness  HEENT:PERRL,Oral mucosa moist, Ear/Nose normal on gross exam Respiratory system: Bilateral equal air entry, normal vesicular breath sounds, no wheezes or crackles  Cardiovascular system: Sinus tachycardia, No JVD, murmurs, rubs, gallops or clicks. Gastrointestinal system: Abdomen is nondistended, soft and nontender. No organomegaly or masses felt. Normal bowel sounds heard. Central nervous system: Alert and oriented. No focal neurological deficits. Extremities: No edema, no clubbing ,no cyanosis, distal peripheral pulses palpable. Skin: No rashes, lesions or ulcers,no icterus ,no pallor Psychiatry: Anxious   Data Reviewed: I have personally reviewed following labs and imaging studies  CBC: Recent Labs  Lab 03/09/18 2220 03/11/18 0419 03/15/18 1523 03/16/18 0518  WBC 10.3 5.2 7.6 7.4  NEUTROABS  --   --  6.5 5.8  HGB 12.0 9.3* 9.2* 9.1*  HCT 37.9 30.0* 28.7* 28.0*  MCV 86.1 88.8 85.2 85.6  PLT 229 176 114* PLATELET CLUMPS NOTED ON SMEAR, COUNT APPEARS DECREASED   Basic Metabolic Panel: Recent Labs  Lab 03/12/18 0806 03/13/18 0535 03/14/18 0419 03/15/18 0232 03/16/18 0518  NA 141 142 141 141 139  K 3.5 3.3* 3.6 3.2* 3.5  CL 110 105 108 106 107  CO2 25 25 23 24 23   GLUCOSE 89 85 77 107* 101*  BUN <5* <5* 6 6 7   CREATININE 1.43* 1.29* 1.25* 1.41* 1.42*   CALCIUM 7.9* 8.2* 8.0* 8.1* 7.2*  MG  --   --  1.4*  --   --    GFR: Estimated Creatinine Clearance: 71.2 mL/min (A) (by C-G formula based on SCr of 1.42 mg/dL (H)). Liver Function Tests: Recent Labs  Lab 03/09/18 2220 03/11/18 0419  AST 35 21  ALT 37 26  ALKPHOS 118 82  BILITOT 1.8* 1.0  PROT 8.1 6.1*  ALBUMIN 3.7 2.8*   Recent Labs  Lab 03/09/18 2220  LIPASE 39   No results for input(s): AMMONIA in the last 168 hours. Coagulation Profile: No results for input(s): INR, PROTIME in the last 168 hours. Cardiac Enzymes: No results for input(s): CKTOTAL, CKMB, CKMBINDEX, TROPONINI in the last 168 hours. BNP (last 3 results) No results for input(s): PROBNP in the last 8760 hours. HbA1C: No results for input(s): HGBA1C in the last 72 hours. CBG: No results for input(s): GLUCAP in the last 168 hours. Lipid Profile: No results for input(s): CHOL, HDL, LDLCALC, TRIG, CHOLHDL, LDLDIRECT in the last 72 hours. Thyroid Function Tests: No results for input(s): TSH, T4TOTAL, FREET4, T3FREE, THYROIDAB in the last 72 hours. Anemia Panel: No results for input(s): VITAMINB12, FOLATE, FERRITIN, TIBC, IRON, RETICCTPCT in the last 72 hours. Sepsis Labs: No results for input(s):  PROCALCITON, LATICACIDVEN in the last 168 hours.  Recent Results (from the past 240 hour(s))  Urine Culture     Status: None   Collection Time: 03/10/18  6:55 PM  Result Value Ref Range Status   Specimen Description URINE, RANDOM  Final   Special Requests NONE  Final   Culture   Final    NO GROWTH Performed at [facility], [address], [place], [place] [address]    Report Status 03/11/2018 FINAL  Final  Culture, blood (routine x 2)     Status: None (Preliminary result)   Collection Time: 03/15/18  2:10 PM  Result Value Ref Range Status   Specimen Description BLOOD LEFT ANTECUBITAL  Final   Special Requests   Final    BOTTLES DRAWN AEROBIC ONLY Blood Culture results may not be optimal due to an  inadequate volume of blood received in culture bottles   Culture   Final    NO GROWTH < 24 HOURS Performed at [facility], [address], [place], [place] [address]    Report Status PENDING  Incomplete  Culture, blood (routine x 2)     Status: None (Preliminary result)   Collection Time: 03/15/18  2:17 PM  Result Value Ref Range Status   Specimen Description BLOOD LEFT HAND  Final   Special Requests   Final    BOTTLES DRAWN AEROBIC ONLY Blood Culture adequate volume   Culture   Final    NO GROWTH < 24 HOURS Performed at [facility], [address], [place], [place] [address]    Report Status PENDING  Incomplete         Radiology Studies: Dg Chest 1 View  Result Date: 03/15/2018 CLINICAL DATA:  Fever. EXAM: CHEST  1 VIEW COMPARISON:  None. FINDINGS: Lungs clear. Heart size normal. No pneumothorax or pleural fluid. No acute or focal bony abnormality. IMPRESSION: Negative chest. Electronically Signed   By: Drusilla Kanner M.D.   On: 03/15/2018 15:27        Scheduled Meds: . enoxaparin (LOVENOX) injection  40 mg Subcutaneous Q24H  . lidocaine  15 mL Mouth/Throat Once  . lipase/protease/amylase  72,000 Units Oral TID WC  . mouth rinse  15 mL Mouth Rinse BID   Continuous Infusions: . sodium chloride Stopped (03/15/18 1608)  . aztreonam 1 g (03/16/18 0537)     LOS: 6 days    Time spent: 25 mins.More than 50% of that time was spent in counseling and/or coordination of care.      Burnadette Pop, MD [facility] Pager [phone]  If 7PM-7AM, please contact night-coverage [email or web address] Password [facility] 03/16/2018, 3:48 PM

## 2018-03-16 NOTE — Progress Notes (Signed)
Patient tolerated 60 mL of Ensure but has not taken her third 30 mL yet patient is asleep.

## 2018-03-16 NOTE — Consult Note (Signed)
[facility] Face-to-Face Psychiatry Consult   Reason for Consult:  Severe depression and anxiety Referring Physician:  Dr. Tawanna Solo Patient Identification: Sharon Duncan MRN:  [id number] Principal Diagnosis: Adjustment disorder with depressed mood Diagnosis:   Patient Active Problem List   Diagnosis Date Noted  . Nausea & vomiting [R11.2] 03/10/2018  . Obesity, Class III, BMI 40-49.9 (morbid obesity) ([place]) [E66.01] 03/10/2018  . CKD (chronic kidney disease), stage III ([place]) [N18.3] 03/10/2018  . Acute recurrent pancreatitis [K85.90] 02/03/2018    Total Time spent with patient: 1 hour  Subjective:   Sharon Duncan is a 27 y.o. female patient admitted with pancreatic pseudocyst.  HPI:   Per chart review, patient was admitted with nausea and vomiting and was found to have a pancreatic pseudocyst. She was admitted in July for similar presentation. She developed pancreatitis after her pregnancy in July. Psychiatry was consulted for management of severe depression and anxiety.   On interview, Sharon Duncan denies feelings of depression or anxiety.  Primary care team reports that she has been withdrawn.  She continues to change her mind about having a feeding tube.  She was screaming and yelling when the feeding tube was attempted to be placed earlier today.  She declined feeding tube placement with sedation as well.  She reports that she does not believe that she can tolerate a feeding tube.  She reports that with her last hospitalization she was NPO for a few days and then was able to tolerate PO intake again.  She understands the risk of malnutrition due to poor PO intake.  She reports that her mood has been okay before coming to the hospital in the setting of her current medical condition.  She does appear to be frustrated with her situation.  She reports that she has not been able to spend much time with her infant son due to being in the hospital.  She reports that her fianc is supportive and her  children are doing well.  She denies problems with sleep or appetite (prior to illness).  She reports a total weight loss of 80 pounds (30 pounds following pregnancy).  She attributes her weight loss to worsening nausea and vomiting related to her medical condition.  She denies a history of manic symptoms (decreased need for sleep, increased energy, pressured speech or euphoria).  She denies SI, HI or AVH.  Past Psychiatric History: Depression   Risk to Self: Is patient at risk for suicide?: No Risk to Others:  None. Denies HI.  Prior Inpatient Therapy:  She was hospitalized 3 years ago for suicide attempt by cutting after an argument with her fiance. She did not require medical intervention.  Prior Outpatient Therapy:  Denies   Past Medical History:  Past Medical History:  Diagnosis Date  . CKD (chronic kidney disease), stage III ([place]) 03/10/2018  . Depression   . Frequent UTI   . Obesity hypoventilation syndrome ([place]) 03/10/2018  . Pancreatitis due to common bile duct stone     Past Surgical History:  Procedure Laterality Date  . CHOLECYSTECTOMY    . KIDNEY SURGERY  2004   ?to lift kidney? d/t frequent UTIs  . TOOTH EXTRACTION N/A 03/03/2014   Procedure: EXTRACTION OF WISDOM TEETH;  Surgeon: [name], DDS;  Location: [facility];  Service: Oral Surgery;  Laterality: N/A;   Family History:  Family History  Problem Relation Age of Onset  . Other Neg Hx    Family Psychiatric  History: Denies  Social History:  Social  History   Substance and Sexual Activity  Alcohol Use Yes   Comment: Rare     Social History   Substance and Sexual Activity  Drug Use No    Social History   Socioeconomic History  . Marital status: Single    Spouse name: Not on file  . Number of children: 4  . Years of education: some college  . Highest education level: Some college, no degree  Occupational History  . Occupation: stay at home mom  Social Needs  . Financial resource strain: Somewhat hard   . Food insecurity:    Worry: Never true    Inability: Never true  . Transportation needs:    Medical: No    Non-medical: No  Tobacco Use  . Smoking status: Never Smoker  . Smokeless tobacco: Never Used  Substance and Sexual Activity  . Alcohol use: Yes    Comment: Rare  . Drug use: No  . Sexual activity: Yes    Birth control/protection: Condom  Lifestyle  . Physical activity:    Days per week: 1 day    Minutes per session: 30 min  . Stress: Not at all  Relationships  . Social connections:    Talks on phone: Once a week    Gets together: Once a week    Attends religious service: More than 4 times per year    Active member of club or organization: No    Attends meetings of clubs or organizations: Never    Relationship status: Living with partner  Other Topics Concern  . Not on file  Social History Narrative  . Not on file   Additional Social History: She lives at home with her fiance of 7 years. She has a 11 month old son, 4 y/o, 26 y/o and 21 y/o.She is unemployed and has been a homemaker for 2 years. She denies alcohol or illicit substance use.     Allergies:   Allergies  Allergen Reactions  . Rocephin [Ceftriaxone] Rash    Labs:  Results for orders placed or performed during the hospital encounter of 03/09/18 (from the past 48 hour(s))  Basic metabolic panel     Status: Abnormal   Collection Time: 03/15/18  2:32 AM  Result Value Ref Range   Sodium 141 135 - 145 mmol/L   Potassium 3.2 (L) 3.5 - 5.1 mmol/L   Chloride 106 98 - 111 mmol/L   CO2 24 22 - 32 mmol/L   Glucose, Bld 107 (H) 70 - 99 mg/dL   BUN 6 6 - 20 mg/dL   Creatinine, Ser 1.41 (H) 0.44 - 1.00 mg/dL   Calcium 8.1 (L) 8.9 - 10.3 mg/dL   GFR calc non Af Amer 51 (L) >60 mL/min   GFR calc Af Amer 59 (L) >60 mL/min    Comment: (NOTE) The eGFR has been calculated using the CKD EPI equation. This calculation has not been validated in all clinical situations. eGFR's persistently <60 mL/min signify  possible Chronic Kidney Disease.    Anion gap 11 5 - 15    Comment: Performed at [facility] [address] [place], [place] [address]  Culture, blood (routine x 2)     Status: None (Preliminary result)   Collection Time: 03/15/18  2:10 PM  Result Value Ref Range   Specimen Description BLOOD LEFT ANTECUBITAL    Special Requests      BOTTLES DRAWN AEROBIC ONLY Blood Culture results may not be optimal due to an inadequate volume  of blood received in culture bottles   Culture      NO GROWTH < 24 HOURS Performed at [facility] [address], [place], [place] [address]    Report Status PENDING   Culture, blood (routine x 2)     Status: None (Preliminary result)   Collection Time: 03/15/18  2:17 PM  Result Value Ref Range   Specimen Description BLOOD LEFT HAND    Special Requests      BOTTLES DRAWN AEROBIC ONLY Blood Culture adequate volume   Culture      NO GROWTH < 24 HOURS Performed at [facility], [place] [address], [place], [place] [address]    Report Status PENDING   CBC with Differential/Platelet     Status: Abnormal   Collection Time: 03/15/18  3:23 PM  Result Value Ref Range   WBC 7.6 4.0 - 10.5 K/uL   RBC 3.37 (L) 3.87 - 5.11 MIL/uL   Hemoglobin 9.2 (L) 12.0 - 15.0 g/dL   HCT 28.7 (L) 36.0 - 46.0 %   MCV 85.2 78.0 - 100.0 fL   MCH 27.3 26.0 - 34.0 pg   MCHC 32.1 30.0 - 36.0 g/dL   RDW 16.1 (H) 11.5 - 15.5 %   Platelets 114 (L) 150 - 400 K/uL    Comment: REPEATED TO VERIFY PLATELET COUNT CONFIRMED BY SMEAR    Neutrophils Relative % 85 %   Neutro Abs 6.5 1.7 - 7.7 K/uL   Lymphocytes Relative 9 %   Lymphs Abs 0.7 0.7 - 4.0 K/uL   Monocytes Relative 5 %   Monocytes Absolute 0.4 0.1 - 1.0 K/uL   Eosinophils Relative 0 %   Eosinophils Absolute 0.0 0.0 - 0.7 K/uL   Basophils Relative 0 %   Basophils Absolute 0.0 0.0 - 0.1 K/uL   Immature Granulocytes 1 %   Abs Immature Granulocytes 0.1 0.0 - 0.1 K/uL    Comment: Performed at [facility], [address] [place], [place] [address]  Urinalysis, Routine w reflex microscopic     Status: Abnormal   Collection Time: 03/15/18  8:37 PM  Result Value Ref Range   Color, Urine YELLOW YELLOW   APPearance HAZY (A) CLEAR   Specific Gravity, Urine 1.008 1.005 - 1.030   pH 6.0 5.0 - 8.0   Glucose, UA NEGATIVE NEGATIVE mg/dL   Hgb urine dipstick MODERATE (A) NEGATIVE   Bilirubin Urine NEGATIVE NEGATIVE   Ketones, ur NEGATIVE NEGATIVE mg/dL   Protein, ur 30 (A) NEGATIVE mg/dL   Nitrite NEGATIVE NEGATIVE   Leukocytes, UA LARGE (A) NEGATIVE   RBC / HPF 0-5 0 - 5 RBC/hpf   WBC, UA >50 (H) 0 - 5 WBC/hpf   Bacteria, UA RARE (A) NONE SEEN   Squamous Epithelial / LPF 0-5 0 - 5    Comment: Performed at [facility], [place] [address] [place], [place] [address]  Basic metabolic panel     Status: Abnormal   Collection Time: 03/16/18  5:18 AM  Result Value Ref Range   Sodium 139 135 - 145 mmol/L   Potassium 3.5 3.5 - 5.1 mmol/L   Chloride 107 98 - 111 mmol/L   CO2 23 22 - 32 mmol/L   Glucose, Bld 101 (H) 70 - 99 mg/dL   BUN 7 6 - 20 mg/dL   Creatinine, Ser 1.42 (H) 0.44 - 1.00 mg/dL   Calcium 7.2 (L) 8.9 - 10.3 mg/dL   GFR calc non  Af Amer 51 (L) >60 mL/min   GFR calc Af Amer 59 (L) >60 mL/min    Comment: (NOTE) The eGFR has been calculated using the CKD EPI equation. This calculation has not been validated in all clinical situations. eGFR's persistently <60 mL/min signify possible Chronic Kidney Disease.    Anion gap 9 5 - 15    Comment: Performed at [facility] [address] [place], [place] [address]  CBC with Differential/Platelet     Status: Abnormal   Collection Time: 03/16/18  5:18 AM  Result Value Ref Range   WBC 7.4 4.0 - 10.5 K/uL    Comment: WHITE COUNT CONFIRMED ON SMEAR   RBC 3.27 (L) 3.87 - 5.11 MIL/uL   Hemoglobin 9.1 (L) 12.0 - 15.0 g/dL    Comment: REPEATED TO VERIFY   HCT 28.0 (L) 36.0 - 46.0 %   MCV 85.6 78.0 - 100.0 fL   MCH 27.8 26.0 - 34.0 pg    MCHC 32.5 30.0 - 36.0 g/dL   RDW 16.2 (H) 11.5 - 15.5 %   Platelets  150 - 400 K/uL    PLATELET CLUMPS NOTED ON SMEAR, COUNT APPEARS DECREASED   Neutrophils Relative % 79 %   Lymphocytes Relative 16 %   Monocytes Relative 4 %   Eosinophils Relative 1 %   Basophils Relative 0 %   Neutro Abs 5.8 1.7 - 7.7 K/uL   Lymphs Abs 1.2 0.7 - 4.0 K/uL   Monocytes Absolute 0.3 0.1 - 1.0 K/uL   Eosinophils Absolute 0.1 0.0 - 0.7 K/uL   Basophils Absolute 0.0 0.0 - 0.1 K/uL   WBC Morphology INCREASED BANDS (>20% BANDS)     Comment: ATYPICAL LYMPHOCYTES TOXIC GRANULATION DOHLE BODIES Performed at [facility], [address], [place], [place] [address]     Current Facility-Administered Medications  Medication Dose Route Frequency Provider Last Rate Last Dose  . 0.9 %  sodium chloride infusion   Intravenous Continuous Shelly Coss, MD   Stopped at 03/15/18 1608  . acetaminophen (TYLENOL) tablet 650 mg  650 mg Oral Q6H PRN Purohit, Shrey C, MD   650 mg at 03/15/18 2009   Or  . acetaminophen (TYLENOL) suppository 650 mg  650 mg Rectal Q6H PRN Purohit, Shrey C, MD      . ALPRAZolam Duanne Moron) tablet 0.5 mg  0.5 mg Oral TID PRN Shelly Coss, MD   0.5 mg at 03/15/18 1225  . aztreonam (AZACTAM) 1 g in sodium chloride 0.9 % 100 mL IVPB  1 g Intravenous Q8H Adhikari, Amrit, MD 200 mL/hr at 03/16/18 0537 1 g at 03/16/18 0537  . enoxaparin (LOVENOX) injection 40 mg  40 mg Subcutaneous Q24H Purohit, Shrey C, MD   40 mg at 03/16/18 1024  . iopamidol (ISOVUE-300) 61 % injection 25 mL  25 mL Oral Once PRN Adhikari, Amrit, MD      . lidocaine (XYLOCAINE) 2 % viscous mouth solution 15 mL  15 mL Mouth/Throat Once Adhikari, Amrit, MD      . lipase/protease/amylase (CREON) capsule 72,000 Units  72,000 Units Oral TID WC Shelly Coss, MD   72,000 Units at 03/13/18 1712  . LORazepam (ATIVAN) injection 1 mg  1 mg Intravenous Once Shelly Coss, MD      . MEDLINE mouth rinse  15 mL Mouth Rinse BID  Rise Patience, MD   15 mL at 03/15/18 0953  . polyethylene glycol (MIRALAX / GLYCOLAX) packet 17 g  17 g Oral Daily  PRN Purohit, Konrad Dolores, MD      . promethazine (PHENERGAN) injection 12.5 mg  12.5 mg Intravenous Q6H PRN Purohit, Shrey C, MD   12.5 mg at 03/14/18 0222  . traMADol (ULTRAM) tablet 50 mg  50 mg Oral Q6H PRN Purohit, Konrad Dolores, MD        Musculoskeletal: Strength & Muscle Tone: decreased due to generalized weakness.  Gait & Station: UTA since patient is lying in bed. Patient leans: N/A  Psychiatric Specialty Exam: Physical Exam  Nursing note and vitals reviewed. Constitutional: She is oriented to person, place, and time. She appears well-developed and well-nourished.  HENT:  Head: Normocephalic and atraumatic.  Neck: Normal range of motion.  Respiratory: Effort normal.  Musculoskeletal: Normal range of motion.  Neurological: She is alert and oriented to person, place, and time.  Skin: No rash noted.  Psychiatric: Her speech is normal and behavior is normal. Judgment and thought content normal. Cognition and memory are normal. She exhibits a depressed mood.    Review of Systems  Constitutional: Negative for chills and fever.  Cardiovascular: Negative for chest pain.  Gastrointestinal: Positive for diarrhea and nausea. Negative for abdominal pain, constipation and vomiting.  Psychiatric/Behavioral: Negative for depression, hallucinations, substance abuse and suicidal ideas. The patient is not nervous/anxious.   All other systems reviewed and are negative.   Blood pressure 130/89, pulse (!) 113, temperature 98.9 F (37.2 C), temperature source Oral, resp. rate 20, height '5\' 4"'  (1.626 m), weight 104.3 kg, last menstrual period 03/08/2018, SpO2 100 %, unknown if currently breastfeeding.Body mass index is 39.48 kg/m.  General Appearance: Fairly Groomed, morbidly obese, young, Caucasian female, wearing a hospital gown and lying in bed on her left side. NAD.   Eye Contact:   Fair  Speech:  Clear and Coherent and Normal Rate  Volume:  Normal  Mood:  "Okay"  Affect:  Non-Congruent, Constricted and Depressed  Thought Process:  Goal Directed, Linear and Descriptions of Associations: Intact  Orientation:  Full (Time, Place, and Person)  Thought Content:  Logical  Suicidal Thoughts:  No  Homicidal Thoughts:  No  Memory:  Immediate;   Good Recent;   Good Remote;   Good  Judgement:  Poor  Insight:  Fair  Psychomotor Activity:  Normal  Concentration:  Concentration: Good and Attention Span: Good  Recall:  Good  Fund of Knowledge:  Good  Language:  Good  Akathisia:  No  Handed:  Right  AIMS (if indicated):   N/A  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 female who was admitted with pancreatic pseudocyst. Psychiatry was consulted for depression and anxiety. She denies depression or anxiety although does appear to be frustrated with her medical condition and her presentation may be more related to an adjustment disorder due to her medical condition. She reports doing well prior to hospitalization. She denies SI, HI or AVH. She may benefit from low dose Remeron for nausea and irritability.  Treatment Plan Summary: -Patient denies active psychiatric symptoms at this time.  -Consider Remeron DT 15 mg qhs for nausea. May be beneficial for irritability/mood as well.  -EKG reviewed and QTc 460. Please closely monitor when starting or increasing QTc prolonging agents.  -Psychiatry will sign off on patient at this time. Please consult psychiatry again as needed.   Disposition: No evidence of imminent risk to  self or others at present.   Patient does not meet criteria for psychiatric inpatient admission.  Faythe Dingwall, DO 03/16/2018 1:39 PM

## 2018-03-17 DIAGNOSIS — F4321 Adjustment disorder with depressed mood: Secondary | ICD-10-CM

## 2018-03-17 MED ORDER — ENSURE ACTIVE HIGH PROTEIN PO LIQD: 1.0000 | Freq: Two times a day (BID) | ORAL | 0 refills | Status: DC

## 2018-03-17 MED ORDER — METOCLOPRAMIDE HCL 10 MG PO TABS: 10.0000 mg | ORAL_TABLET | Freq: Three times a day (TID) | ORAL | 0 refills | Status: DC

## 2018-03-17 MED ORDER — NITROFURANTOIN MONOHYD MACRO 100 MG PO CAPS: 100.0000 mg | ORAL_CAPSULE | Freq: Two times a day (BID) | ORAL | 0 refills | Status: AC

## 2018-03-17 MED ORDER — ONDANSETRON HCL 4 MG PO TABS: 4.0000 mg | ORAL_TABLET | Freq: Four times a day (QID) | ORAL | 0 refills | Status: DC | PRN

## 2018-03-17 NOTE — Progress Notes (Signed)
Pt refused to take 30 ml of Ensure. RN offered Ensure multiple times, and explained plan and goal to pt, pt still refused.

## 2018-03-17 NOTE — Discharge Summary (Signed)
Discharge Summary  Sharon Duncan ZOX:[id number] DOB: 02-Mar-1991  PCP: System, Pcp Not In  Admit date: 03/09/2018 Discharge date: 03/17/2018  Time spent: 25 minutes  Recommendations for Outpatient Follow-up:  1. Primary care provider follow-up within 2 to 3 days  Discharge Diagnoses:  Active Hospital Problems   Diagnosis Date Noted  . Adjustment disorder with depressed mood   . Acute lower UTI 03/16/2018  . Pancreatic pseudocyst 03/16/2018  . Severe malnutrition (HCC) 03/16/2018  . Nausea & vomiting 03/10/2018  . Obesity, Class III, BMI 40-49.9 (morbid obesity) (HCC) 03/10/2018  . CKD (chronic kidney disease), stage III (HCC) 03/10/2018    Resolved Hospital Problems  No resolved problems to display.    Discharge Condition: Slightly improved patient insisted on being discharged she was threatening to go AGAINST MEDICAL ADVICE  Diet recommendation: Clear liquid to full liquid as tolerated with Ensure supplements increase fluid intake  Vitals:   03/16/18 2041 03/17/18 0600  BP: 132/81 108/64  Pulse: (!) 125 100  Resp:  18  Temp: 98.6 F (37 C) 98 F (36.7 C)  SpO2: 99% 100%    History of present illness:  Patient is a 27 year old female with past medical history of acute pancreatitis, persistent nausea and vomiting who presented to the emergency department with complaints of nausea and vomiting which has been going on for a while.  Patient was admitted here on July for the management of pancreatitis, pancreatic pseudocyst.  She was diagnosed with this after her recent pregnancy.   Hospital Course:  Principal Problem:   Adjustment disorder with depressed mood Active Problems:   Nausea & vomiting   Obesity, Class III, BMI 40-49.9 (morbid obesity) (HCC)   CKD (chronic kidney disease), stage III (HCC)   Acute lower UTI   Pancreatic pseudocyst   Severe malnutrition (HCC)  Patient is a 28 year old female with past medical history of acute pancreatitis, persistent  nausea and vomiting who presented to the emergency department with complaints of nausea and vomiting which has been going on for a while.  Patient was admitted here on July for the management of pancreatitis, pancreatic pseudocyst.  She was diagnosed with this after her recent pregnancy.  CT abdomen/pelvis done on this admission shows circumcised collection arising from the body and tail of the pancreas concerning for abscess/leak/necrosis.  MRI was suggestive of developing pseudocyst and less suspicious for abscess.  GI and general surgery were consulted .  No plan for any intervention right now.  Her hospital course has been remarkable for persistent nausea/vomiting/poor oral intake.  She continued to have poor oral intake, GI recommended to put feeding tube which she declined after multiple attempts.  Patient threatened to sign out AGAINST MEDICAL ADVICE.  She stated she can tolerate p.o. feeds as long as she has Reglan.  Prescription for Reglan has been given.  She is to follow-up with her primary care provider next week Procedures:  None  Consultations:  GI  general surgery   Discharge Exam: BP 108/64 (BP Location: Left Arm)   Pulse 100   Temp 98 F (36.7 C) (Oral)   Resp 18   Ht 5\' 4"  (1.626 m)   Wt 104.3 kg   LMP 03/08/2018   SpO2 100%   Breastfeeding? Unknown   BMI 39.48 kg/m   General: Obese no distress Cardiovascular: Regular rate and rhythm no murmur Respiratory: CTA bilaterally Extremity with no edema Psych affect appropriate  Discharge Instructions You were cared for by a hospitalist during your hospital stay. If  you have any questions about your discharge medications or the care you received while you were in the hospital after you are discharged, you can call the unit and asked to speak with the hospitalist on call if the hospitalist that took care of you is not available. Once you are discharged, your primary care physician will handle any further medical issues. Please  note that NO REFILLS for any discharge medications will be authorized once you are discharged, as it is imperative that you return to your primary care physician (or establish a relationship with a primary care physician if you do not have one) for your aftercare needs so that they can reassess your need for medications and monitor your lab values.  Discharge Instructions    Call MD for:  persistant nausea and vomiting   Complete by:  As directed    Call MD for:  severe uncontrolled pain   Complete by:  As directed    Diet - low sodium heart healthy   Complete by:  As directed    Discharge instructions   Complete by:  As directed    FOLLOW UP PCP IN 3-7 DAYS   Increase activity slowly   Complete by:  As directed      Allergies as of 03/17/2018      Reactions   Rocephin [ceftriaxone] Rash      Medication List    STOP taking these medications   dimenhyDRINATE 50 MG tablet Commonly known as:  DRAMAMINE   ondansetron 4 MG tablet Commonly known as:  ZOFRAN     TAKE these medications   ENSURE ACTIVE HIGH PROTEIN Liqd Take 1 Can by mouth 2 (two) times daily after a meal.   lipase/protease/amylase [address] UNITS Cpep capsule Commonly known as:  CREON Take 1 capsule (36,000 Units total) by mouth 3 (three) times daily with meals.   metoCLOPramide 10 MG tablet Commonly known as:  REGLAN Take 1 tablet (10 mg total) by mouth 3 (three) times daily with meals.   nitrofurantoin (macrocrystal-monohydrate) 100 MG capsule Commonly known as:  MACROBID Take 1 capsule (100 mg total) by mouth 2 (two) times daily for 5 days.      Allergies  Allergen Reactions  . Rocephin [Ceftriaxone] Rash      The results of significant diagnostics from this hospitalization (including imaging, microbiology, ancillary and laboratory) are listed below for reference.    Significant Diagnostic Studies: Dg Chest 1 View  Result Date: 03/15/2018 CLINICAL DATA:  Fever. EXAM: CHEST  1 VIEW COMPARISON:  None.  FINDINGS: Lungs clear. Heart size normal. No pneumothorax or pleural fluid. No acute or focal bony abnormality. IMPRESSION: Negative chest. Electronically Signed   By: Drusilla Kanner M.D.   On: 03/15/2018 15:27   Ct Abdomen Pelvis W Contrast  Result Date: 03/10/2018 CLINICAL DATA:  Two weeks post cholecystectomy. Nausea and vomiting for 3 days. Generalized abdominal pain and fever. EXAM: CT ABDOMEN AND PELVIS WITH CONTRAST TECHNIQUE: Multidetector CT imaging of the abdomen and pelvis was performed using the standard protocol following bolus administration of intravenous contrast. CONTRAST:  80mL ISOVUE-300 IOPAMIDOL (ISOVUE-300) INJECTION 61% COMPARISON:  None. FINDINGS: Lower chest: Lung bases are clear. Hepatobiliary: Mild diffuse fatty infiltration of the liver. No focal liver lesions. Surgical absence of the gallbladder. Fluid and a small amount of gas in the gallbladder fossa measuring 1.8 x 3.2 cm. This is nonspecific and could represent postoperative collection, abscess, or leak. Pancreas: Circumscribed collection arising from the body and tail of the  pancreas and extending underneath the lesser curvature of the stomach measuring 3.7 x 5.2 cm in diameter. This most likely represents an area of walled off necrosis associated with acute pancreatitis. Abscess would be another consideration, less likely. There is infiltration and edema around the tail of the pancreas and along the left anterior pararenal space. Spleen: Normal in size without focal abnormality. Adrenals/Urinary Tract: No adrenal gland nodules. Bilateral renal parenchymal scarring and atrophy, greater on the left. No hydronephrosis or hydroureter. Nephrograms are symmetrical. Bladder is mostly decompressed. Stomach/Bowel: Stomach, small bowel, and colon are mostly decompressed. No wall thickening is appreciated although evaluation of wall is limited due to under distention. No inflammatory infiltrations. Appendix is normal.  Vascular/Lymphatic: No significant vascular findings are present. No enlarged abdominal or pelvic lymph nodes. Reproductive: Uterus and bilateral adnexa are unremarkable. Other: No free air in the abdomen. Abdominal wall musculature appears intact. Musculoskeletal: No acute or significant osseous findings. IMPRESSION: 1. Fluid and gas in the gallbladder fossa with small amount of gas in the gallbladder fossa. This is probably a postoperative collection, but could represent abscess or leak. Surgical absence of the gallbladder. 2. Circumscribed collection arising from the body and tail of the pancreas. This most likely represents an area of walled off necrosis associated with acute pancreatitis. Abscess would be another consideration. 3. Mild diffuse fatty infiltration of the liver. Electronically Signed   By: Burman NievesWilliam  Stevens M.D.   On: 03/10/2018 04:56   Mr Abdomen Mrcp Vivien RossettiW Wo Contast  Result Date: 03/10/2018 CLINICAL DATA:  Abdominal pain. Acute pancreatitis. Prior cholecystectomy. Weight loss. EXAM: MRI ABDOMEN WITH CONTRAST (WITH MRCP) TECHNIQUE: Multiplanar multisequence MR imaging of the abdomen was performed following the administration of intravenous contrast. Heavily T2-weighted images of the biliary and pancreatic ducts were obtained, and three-dimensional MRCP images were rendered by post processing. CONTRAST:  10mL MULTIHANCE GADOBENATE DIMEGLUMINE 529 MG/ML IV SOLN COMPARISON:  03/10/2018 CT. FINDINGS: Mild to moderate degradation, secondary to motion and suboptimal contrast opacification. Contrast dose was minimized secondary to elevated creatinine. Lower chest: Normal heart size without pericardial or pleural effusion. Hepatobiliary: No suspicious liver lesion. There is moderate hepatic steatosis with wedge-shaped areas of sparing within the right hepatic lobe on series 1002. Cholecystectomy, without intra or extrahepatic biliary duct dilatation. There is ill-defined complex fluid and edema within  the operative bed, including at 3.0 x 2.1 cm on 20/8. No choledocholithiasis. Pancreas: No pancreas divisum or main duct dilatation. There is moderate edema within the anterior pararenal space. A complex fluid collection centered in the lesser sac causes mass effect upon the pancreatic body/tail junction. This impresses into the stomach and may involve the lesser curvature, including on image 68/304. No definite pancreatic necrosis. Spleen:  Normal in size, without focal abnormality. Adrenals/Urinary Tract: Normal adrenal glands. Moderate left and mild right renal scarring. No hydronephrosis. Stomach/Bowel: The gastric body is thickened, primarily the lesser curvature. Normal abdominal large and small bowel loops. Vascular/Lymphatic: Normal caliber of the aorta and branch vessels. Nonocclusive thrombus is suspected in the splenic vein, including on 56/303 and the coronal reformat from today's CT (image 55/6 of that study). No gross abdominal adenopathy. Other:  No ascites. Musculoskeletal: No acute osseous abnormality. IMPRESSION: 1. Motion and suboptimal contrast opacification degradation, as above. 2. No biliary duct dilatation or choledocholithiasis. 3. Complicated pancreatitis, with a complex fluid collection in the lesser sac, likely representing developing pseudocyst. This causes mass effect upon the gastric body and may involve the wall of the lesser curvature. Presumably  secondary gastric wall thickening indicative of gastritis. Nonocclusive thrombus in the splenic vein. 4. Ill-defined fluid collection within the cholecystectomy bed. Differential considerations include postoperative seroma, biloma, or less likely abscess, given absence of significant enhancement. If the cholecystectomy was in the last 5-7 days, this could be a normal postoperative finding. 5. Heterogeneous hepatic steatosis. 6. Renal atrophy, worse on the left. Electronically Signed   By: Jeronimo Greaves M.D.   On: 03/10/2018 14:48     Microbiology: Recent Results (from the past 240 hour(s))  Urine Culture     Status: None   Collection Time: 03/10/18  6:55 PM  Result Value Ref Range Status   Specimen Description URINE, RANDOM  Final   Special Requests NONE  Final   Culture   Final    NO GROWTH Performed at [facility], [address] [place], [place] [address]    Report Status 03/11/2018 FINAL  Final  Culture, blood (routine x 2)     Status: None (Preliminary result)   Collection Time: 03/15/18  2:10 PM  Result Value Ref Range Status   Specimen Description BLOOD LEFT ANTECUBITAL  Final   Special Requests   Final    BOTTLES DRAWN AEROBIC ONLY Blood Culture results may not be optimal due to an inadequate volume of blood received in culture bottles   Culture   Final    NO GROWTH 2 DAYS Performed at [facility], [address], [place], [place] [address]    Report Status PENDING  Incomplete  Culture, blood (routine x 2)     Status: None (Preliminary result)   Collection Time: 03/15/18  2:17 PM  Result Value Ref Range Status   Specimen Description BLOOD LEFT HAND  Final   Special Requests   Final    BOTTLES DRAWN AEROBIC ONLY Blood Culture adequate volume   Culture   Final    NO GROWTH 2 DAYS Performed at [facility], [address] [place], [place] [address]    Report Status PENDING  Incomplete  Culture, Urine     Status: Abnormal (Preliminary result)   Collection Time: 03/15/18  8:23 PM  Result Value Ref Range Status   Specimen Description URINE, RANDOM  Final   Special Requests   Final    NONE Performed at [facility], [address], [place], [place] [address]    Culture 10,000 COLONIES/mL ESCHERICHIA COLI (A)  Final   Report Status PENDING  Incomplete     Labs: Basic Metabolic Panel: Recent Labs  Lab 03/12/18 0806 03/13/18 0535 03/14/18 0419 03/15/18 0232 03/16/18 0518  NA 141 142 141 141 139  K 3.5 3.3* 3.6 3.2* 3.5  CL 110 105 108 106 107  CO2 25 25 23  24 23   GLUCOSE 89 85 77 107* 101*  BUN <5* <5* 6 6 7   CREATININE 1.43* 1.29* 1.25* 1.41* 1.42*  CALCIUM 7.9* 8.2* 8.0* 8.1* 7.2*  MG  --   --  1.4*  --   --    Liver Function Tests: Recent Labs  Lab 03/11/18 0419  AST 21  ALT 26  ALKPHOS 82  BILITOT 1.0  PROT 6.1*  ALBUMIN 2.8*   No results for input(s): LIPASE, AMYLASE in the last 168 hours. No results for input(s): AMMONIA in the last 168 hours. CBC: Recent Labs  Lab 03/11/18 0419 03/15/18 1523 03/16/18 0518  WBC 5.2 7.6 7.4  NEUTROABS  --  6.5 5.8  HGB 9.3* 9.2*  9.1*  HCT 30.0* 28.7* 28.0*  MCV 88.8 85.2 85.6  PLT 176 114* PLATELET CLUMPS NOTED ON SMEAR, COUNT APPEARS DECREASED   Cardiac Enzymes: No results for input(s): CKTOTAL, CKMB, CKMBINDEX, TROPONINI in the last 168 hours. BNP: BNP (last 3 results) No results for input(s): BNP in the last 8760 hours.  ProBNP (last 3 results) No results for input(s): PROBNP in the last 8760 hours.  CBG: No results for input(s): GLUCAP in the last 168 hours.     Signed:  Myrtie Neither, MD [facility] Pager [phone] 03/17/2018, 12:07 PM

## 2018-03-17 NOTE — Progress Notes (Signed)
[facility] Progress [facility]  Sharon BeltonKayla Duncan 26 y.o. 10/23/1990  CC:  Complicated pancreatitis   Subjective: patient seen and examined at bedside. Discussed with nursing staff. Patient remains noncompliant with dietary recommendations. Has declined feeding tube placement. Not drinking ensure is recommended. Afebrile this morning. Denied any vomiting this morning. Currently having one to 2 bowel movements.  ROS : positive for anxiety. Negative for bleeding   Objective: Vital signs in last 24 hours: Vitals:   03/16/18 2041 03/17/18 0600  BP: 132/81 108/64  Pulse: (!) 125 100  Resp:  18  Temp: 98.6 F (37 C) 98 F (36.7 C)  SpO2: 99% 100%    Physical Exam:  Gen. Alert and oriented 3. Abdomen. Soft, nontender, nondistended, bowel sounds present. Psych. Anxious.  Lab Results: Recent Labs    03/15/18 0232 03/16/18 0518  NA 141 139  K 3.2* 3.5  CL 106 107  CO2 24 23  GLUCOSE 107* 101*  BUN 6 7  CREATININE 1.41* 1.42*  CALCIUM 8.1* 7.2*   No results for input(s): AST, ALT, ALKPHOS, BILITOT, PROT, ALBUMIN in the last 72 hours. Recent Labs    03/15/18 1523 03/16/18 0518  WBC 7.6 7.4  NEUTROABS 6.5 5.8  HGB 9.2* 9.1*  HCT 28.7* 28.0*  MCV 85.2 85.6  PLT 114* PLATELET CLUMPS NOTED ON SMEAR, COUNT APPEARS DECREASED   No results for input(s): LABPROT, INR in the last 72 hours.    Assessment/Plan: - complicated pancreatitis with a complex fluid collection in the laser set most likely developing pseudocyst which is also causing mass effect in the gastric body.most likely gallstone pancreatitis. She is  [facility] - nausea and vomiting. Improving. No vomiting today. - around 2 cm ill-defined complex fluid collection within the cholecystectomy bed. Likely postoperative in nature.   Recommendations --------------------------- - Patient remains noncompliant with dietary recommendations. Has declined feeding tube placement. Not drinking  ensure is recommended. - risk of early as well as long-term complication from complicated pancreatitis discussed with the patient. She was advised to be compliant with dietary recommendations.  - Recommend CT scan with IV contrast in 2-4 weeks for follow-up on pseudocyst. She may need interventional radiology guided or EUS guided drainage of the cyst once cyst is matured. - Continue supportive care for now. GI will follow   Kathi DerParag Meriem Lemieux MD, FACP 03/17/2018, 9:01 AM  Contact #  [phone]

## 2018-03-17 NOTE — Progress Notes (Signed)
MD notified of patient states she needs reglan and not zofran.

## 2018-03-17 NOTE — Progress Notes (Signed)
Sharon Duncan to be D/C'd Home per MD order.  Discussed with the patient and all questions fully answered.  VSS, Skin clean, dry and intact without evidence of skin break down, no evidence of skin tears noted. IV catheter discontinued intact. Site without signs and symptoms of complications. Dressing and pressure applied.  An After Visit Summary was printed and given to the patient. Patient received prescription.  D/c education completed with patient/family including follow up instructions, medication list, d/c activities limitations if indicated, with other d/c instructions as indicated by MD - patient able to verbalize understanding, all questions fully answered.   Patient instructed to return to ED, call 911, or call MD for any changes in condition.   Patient escorted via WC, and D/C home via private auto.  [name] 03/17/2018 12:05 PM

## 2018-03-17 NOTE — Progress Notes (Signed)
MD was notified of patient wanting to leave AMA and patient was educated that she can not receive any prescriptions if she leaves AMA. Patient states that she does not care anymore about the prescriptions. I spoke with the MD and gave her an update of the situation. MD says she might need an NG tube to get nutrition and MD said she will discuss it with the patient.

## 2018-03-18 LAB — URINE CULTURE

## 2018-03-20 LAB — CULTURE, BLOOD (ROUTINE X 2)
CULTURE: NO GROWTH
Culture: NO GROWTH
Special Requests: ADEQUATE

## 2018-04-24 ENCOUNTER — Telehealth: Payer: Self-pay | Admitting: Neurology

## 2018-04-24 ENCOUNTER — Ambulatory Visit: Payer: Medicaid Other | Admitting: Neurology

## 2018-04-24 ENCOUNTER — Encounter: Payer: Self-pay | Admitting: Neurology

## 2018-04-24 VITALS — BP 102/78 | HR 135

## 2018-04-24 DIAGNOSIS — G3281 Cerebellar ataxia in diseases classified elsewhere: Secondary | ICD-10-CM

## 2018-04-24 DIAGNOSIS — R269 Unspecified abnormalities of gait and mobility: Secondary | ICD-10-CM

## 2018-04-24 DIAGNOSIS — R202 Paresthesia of skin: Secondary | ICD-10-CM

## 2018-04-24 MED ORDER — GABAPENTIN 300 MG PO CAPS: 300.0000 mg | ORAL_CAPSULE | Freq: Three times a day (TID) | ORAL | 11 refills | Status: DC

## 2018-04-24 NOTE — Telephone Encounter (Signed)
Medicaid order sent to GI. They obtain the auth and will reach out to the pt to schedule.  °

## 2018-04-24 NOTE — Progress Notes (Signed)
PATIENT: Sharon Duncan DOB: Nov 07, 1990  Chief Complaint  Patient presents with  . Numbness    She is here with her husband, Wilber Oliphant.  She started developing numbness in her abdomen and thighs after having her second baby on 01/26/18.  States her symptoms have been progressing since that time.  Says she is now numb from her chest down to her ankles, bilaterally.  She has pain in both feet.  She is unable to walk and arrived in a wheelchair today.  Marland Kitchen PCP    Kela Millin, MD     HISTORICAL  Sharon Duncan is a 27 years old female, seen in request by her primary care physician Dr. Otilio Miu, Marisa Cyphers for evaluation of numbness, initial evaluation was on April 24, 2018.  She is accompanied by her husband Caleb at today's visit.  I have reviewed and summarized the referring note from the referring physician, multiple hospital discharge summary.  She was previously healthy, at the end of her most recent pregnancy, around May 2019, she developed symptoms suggestive of vasculitis, resemble hand-foot and mouth rash, symptoms have resolved, she had normal vaginal delivery on January 26, 2018 with healthy girl, the same day, she developed significant abdominal pain, nausea vomiting, was diagnosed with pancreatitis associated with gallstone, she had a cholecystectomy 2 weeks afterwards, but had multiple hospital admission for persistent nausea, vomiting, most recent hospital admission was on March 17, 2018,  CT of abdomen showed circumcised collection arising from the body and tail of pancreas, concerning for abscess/leak, necrosis, MRI of the abdomen suggestive of developing pseudocyst, less suspicious for abscess, was seen by GI and the surgeon, no intervention is needed, she also has decreased p.o. intake, was given prescription of Reglan,  She also had hospital admission on February 08, 2018, for persistent nausea, vomiting decreased p.o. intake,  Since her most recent hospital discharge on March 17, 2018, she developed numbness from lower abdominal to bilateral lower extremity, bilateral feet feel like burning blister, refused for me to touch her feet because of pain, she denies rash.  She has mild gait abnormality generalized weakness since her delivery, dealing with pancreatitis, but had much worsened since March 17, 2018, now cleaning with a wheelchair,  I personally reviewed MRI of thoracic spine in September 2019, there was no acute abnormality. MRI of the brain that was normal  Laboratory evaluations on February 13, 2018, hemoglobin of 9.1, BMP showed elevated creatinine of 1.42, GFR was 51, UA showed hazy, elevated protein, normal TSH, 1.4, A1c was mildly elevated 6.25 March 2018 showed creatinine of 1.33, GFR of 49, CPK was 21, TSH was 2.4, lipase was 46 6, vitamin B12 was 523, vitamin D was 10.2,  She had complains of generalized weakness, but able to use her upper extremities without significant difficulty, able to roll her wheelchair, she denies neck pain, low back pain, denies bowel and bladder incontinence.  Denies visual loss.  REVIEW OF SYSTEMS: Full 14 system review of systems performed and notable only for as above All other review of systems were negative.  ALLERGIES: Allergies  Allergen Reactions  . Rocephin [Ceftriaxone] Rash    HOME MEDICATIONS: Current Outpatient Medications  Medication Sig Dispense Refill  . ferrous sulfate 325 (65 FE) MG tablet Take 325 mg by mouth daily.    . metoCLOPramide (REGLAN) 10 MG tablet Take 1 tablet (10 mg total) by mouth 3 (three) times daily with meals. 30 tablet 0  . metoprolol succinate (TOPROL-XL) 25 MG  24 hr tablet Take by mouth.    . oxyCODONE-acetaminophen (PERCOCET) 10-325 MG tablet Take by mouth.    . progesterone (PROMETRIUM) 200 MG capsule START TONIGHT ONE CAPSULE, THEN ONE CAPSULE FOUR TIMES DAILY FOR SEVEN DAYS, 2ND WEEK ONE CAPSULE THREE TIMES DAILY, 3RD WEEK ONE CAPSULE TW    . venlafaxine XR (EFFEXOR-XR) 75 MG  24 hr capsule Take by mouth.    . Vitamin D, Ergocalciferol, (DRISDOL) 50000 units CAPS capsule Take by mouth.     No current facility-administered medications for this visit.     PAST MEDICAL HISTORY: Past Medical History:  Diagnosis Date  . CKD (chronic kidney disease), stage III (HCC) 03/10/2018  . Depression   . Frequent UTI   . Hypertension   . Obesity hypoventilation syndrome (HCC) 03/10/2018  . Pancreatitis due to common bile duct stone   . Paresthesia   . Vitamin D deficiency     PAST SURGICAL HISTORY: Past Surgical History:  Procedure Laterality Date  . CHOLECYSTECTOMY    . KIDNEY SURGERY  2004   ?to lift kidney? d/t frequent UTIs  . TOOTH EXTRACTION N/A 03/03/2014   Procedure: EXTRACTION OF WISDOM TEETH;  Surgeon: Georgia Lopes, DDS;  Location: [facility] OR;  Service: Oral Surgery;  Laterality: N/A;    FAMILY HISTORY: Family History  Problem Relation Age of Onset  . Lung disease Mother   . Other Father        died in car accident when patient was 27 years old    SOCIAL HISTORY: Social History   Socioeconomic History  . Marital status: Single    Spouse name: Not on file  . Number of children: 4  . Years of education: some college  . Highest education level: Some college, no degree  Occupational History  . Occupation: stay at home mom  Social Needs  . Financial resource strain: Somewhat hard  . Food insecurity:    Worry: Never true    Inability: Never true  . Transportation needs:    Medical: No    Non-medical: No  Tobacco Use  . Smoking status: Never Smoker  . Smokeless tobacco: Never Used  Substance and Sexual Activity  . Alcohol use: Yes    Comment: Rare  . Drug use: No  . Sexual activity: Yes    Birth control/protection: Condom  Lifestyle  . Physical activity:    Days per week: 1 day    Minutes per session: 30 min  . Stress: Not at all  Relationships  . Social connections:    Talks on phone: Once a week    Gets together: Once a week     Attends religious service: More than 4 times per year    Active member of club or organization: No    Attends meetings of clubs or organizations: Never    Relationship status: Living with partner  . Intimate partner violence:    Fear of current or ex partner: No    Emotionally abused: No    Physically abused: No    Forced sexual activity: No  Other Topics Concern  . Not on file  Social History Narrative   Lives at home with husband and four children.   Right-handed.   No daily caffeine use.     PHYSICAL EXAM   There were no vitals filed for this visit.  Not recorded      There is no height or weight on file to calculate BMI.  PHYSICAL EXAMNIATION:  Gen: NAD,  conversant, well nourised, obese, well groomed                     Cardiovascular: Regular rate rhythm, no peripheral edema, warm, nontender. Eyes: Conjunctivae clear without exudates or hemorrhage Neck: Supple, no carotid bruits. Pulmonary: Clear to auscultation bilaterally   NEUROLOGICAL EXAM:  MENTAL STATUS: Speech:    Speech is normal; fluent and spontaneous with normal comprehension.  Cognition:     Orientation to time, place and person     Normal recent and remote memory     Normal Attention span and concentration     Normal Language, naming, repeating,spontaneous speech     Fund of knowledge   CRANIAL NERVES: CN II: Visual fields are full to confrontation. Fundoscopic exam is normal with sharp discs and no vascular changes. Pupils are round equal and briskly reactive to light. CN III, IV, VI: extraocular movement are normal. No ptosis. CN V: Facial sensation is intact to pinprick in all 3 divisions bilaterally. Corneal responses are intact.  CN VII: Face is symmetric with normal eye closure and smile. CN VIII: Hearing is normal to rubbing fingers CN IX, X: Palate elevates symmetrically. Phonation is normal. CN XI: Head turning and shoulder shrug are intact CN XII: Tongue is midline with normal  movements and no atrophy.  MOTOR: She is sitting in wheelchair, bilateral upper extremity proximal and distal muscle strength is normal, severe bilateral feet allodynia, refused for me to touch or exam, able to raise bilateral hip against gravity,  REFLEXES: Reflexes are 2+ and symmetric at the biceps, triceps, knees,  SENSORY:  Length dependent decreased light touch, vibratory sensation prick to bilateral knee level   COORDINATION: Rapid alternating movements and fine finger movements are intact. There is no dysmetria on finger-to-nose and heel-knee-shin.    GAIT/STANCE: Deferred  DIAGNOSTIC DATA (LABS, IMAGING, TESTING) - I reviewed patient records, labs, notes, testing and imaging myself where available.   ASSESSMENT AND PLAN  Myndi Wamble is a 27 y.o. female   Subacute onset of bilateral lower extremity sensory loss, and weakness  Brisk reflux of bilateral upper extremity and patella,  Need to rule out cervical myelopathy, lumbar radiculopathy  Proceed with MRI of cervical spine, MRI of lumbar spine  Laboratory evaluations, including copper, B12 level, NMO antibody  Gabapentin 300 mg 3 times daily   Levert Feinstein, M.D. Ph.D.  [facility] [address], [address] [place], [place] [address] Ph: [phone] Fax: [phone]  CC:  Kela Millin, MD

## 2018-04-25 NOTE — Addendum Note (Signed)
Addended by: Levert Feinstein on: 04/25/2018 10:28 AM   Modules accepted: Orders

## 2018-04-25 NOTE — Telephone Encounter (Signed)
Noted, thank you.. Medicaid is pending.. Once approved I can schedule at [facility]. Patient husband is aware of this.

## 2018-04-25 NOTE — Telephone Encounter (Signed)
Reentered order of MRI cervical and lumbar to [facility]

## 2018-04-25 NOTE — Telephone Encounter (Signed)
I called [facility] to check the status it is still pending they did receive my fax of clinical notes.

## 2018-04-25 NOTE — Telephone Encounter (Signed)
Patient was scheduled at GI but since she is in a wheel chair and they will not let her boyfriend to help her get on the table she will have to go to [facility] hospital because they have a lift..   When you get a chance can you put a new MRI order in for Sharon Duncan.

## 2018-04-26 ENCOUNTER — Telehealth: Payer: Self-pay | Admitting: Neurology

## 2018-04-26 ENCOUNTER — Telehealth: Payer: Self-pay | Admitting: *Deleted

## 2018-04-26 LAB — VITAMIN B12: Vitamin B-12: 566 pg/mL (ref 232–1245)

## 2018-04-26 LAB — HIV ANTIBODY (ROUTINE TESTING W REFLEX): HIV SCREEN 4TH GENERATION: NONREACTIVE

## 2018-04-26 LAB — CK: CK TOTAL: 38 U/L (ref 24–173)

## 2018-04-26 LAB — RPR: RPR: NONREACTIVE

## 2018-04-26 LAB — FOLATE: FOLATE: 2.4 ng/mL — AB (ref >3.0)

## 2018-04-26 LAB — COPPER, SERUM: COPPER: 121 ug/dL (ref 72–166)

## 2018-04-26 MED ORDER — OXCARBAZEPINE 150 MG PO TABS: 150.0000 mg | ORAL_TABLET | Freq: Two times a day (BID) | ORAL | 11 refills | Status: DC

## 2018-04-26 NOTE — Telephone Encounter (Signed)
Please call patient, lab showed low Folic acid 2.4  She should take over counter folic acid supplement, rest of the lab tests were normal.

## 2018-04-26 NOTE — Telephone Encounter (Signed)
Spoke to [employer] (boyfriend on [place]) who says gabapentin 300mg , one capsule TID is not helpful for her pain.  Per vo by Dr. Terrace Arabia, add oxcarbazepine 150mg , one capsule BID to her medication regimen.  The patient is agreeable and the rx has been sent to the pharmacy.

## 2018-04-26 NOTE — Telephone Encounter (Signed)
Spoke to patient's boyfriend, Olive Bass (on [place]), who is aware of her lab results.  Per vo by Dr. Terrace Arabia, she would like for her to start OTC folic acid 1mg  daily.  He verbalized understanding.

## 2018-04-26 NOTE — Telephone Encounter (Signed)
noted 

## 2018-04-26 NOTE — Telephone Encounter (Signed)
Pts boyfriend(Caleb on DPR) called stating that gabapentin (NEURONTIN) 300 MG capsule hasnt helped with the pain, requesting a call to discuss a different medication, stating the pts pain level is a 9. Please advise

## 2018-04-26 NOTE — Telephone Encounter (Signed)
Please see previous phone note from 04/24/18. I talked to the patient boyfriend yesterday and informed him I would schedule it at the hospital but right now the insurance is pending with medicaid. I informed him once it has been approved I would schedule it.

## 2018-04-26 NOTE — Telephone Encounter (Signed)
Spoke to patient's boyfriend, Olive Bass (on [place]) who states she is in a wheelchair and unable to stand on her own right now.  He said [facility] told him she would require medical transportion (to assist with lifting) to have her MRI done there.  They are unable to afford this additional service.  He was instructed to call here and request her MRI scans to be completed at the hospital.

## 2018-04-27 LAB — NEUROMYELITIS OPTICA AUTOAB, IGG

## 2018-04-30 NOTE — Telephone Encounter (Signed)
Patient boyfriend Sharon Duncan called me back and I informed him all this information about the MRI appt at [facility].Marland Kitchen He also has their number of [phone] just incase he needed to r/s for any reason.

## 2018-04-30 NOTE — Telephone Encounter (Signed)
Medicaid Berkley Harvey: [id number] (exp. 04/25/18 to 05/25/18)  Patient is scheduled at [facility] for 05/07/18 that was their first available for a double study. The arrival time is 11:30 to go to the 2nd floor at admitting at [facility].  I did try to call the patient to give her this information but her mail box was full. I will try again.

## 2018-04-30 NOTE — Telephone Encounter (Signed)
Patient boyfriend caleb called me back and I informed him all this information about the MRI appt at [facility].. He also has their number of [phone] just incase he needed to r/s for any reason.  °

## 2018-04-30 NOTE — Telephone Encounter (Signed)
Medicaid auth: [id number] (exp. 04/25/18 to 05/25/18) ° °Patient is scheduled at [facility] for 05/07/18 that was their first available for a double study. The arrival time is 11:30 to go to the 2nd floor at admitting at [facility]. ° °I did try to call the patient to give her this information but her mail box was full. I will try again.  °

## 2018-05-07 ENCOUNTER — Ambulatory Visit (HOSPITAL_COMMUNITY)
Admission: RE | Admit: 2018-05-07 | Discharge: 2018-05-07 | Disposition: A | Discharge status: Home / Self Care | Payer: Medicaid Other | Source: Ambulatory Visit | Attending: Neurology | Admitting: Neurology

## 2018-05-07 ENCOUNTER — Telehealth: Payer: Self-pay | Admitting: Neurology

## 2018-05-07 DIAGNOSIS — G3281 Cerebellar ataxia in diseases classified elsewhere: Secondary | ICD-10-CM

## 2018-05-07 DIAGNOSIS — E882 Lipomatosis, not elsewhere classified: Secondary | ICD-10-CM | POA: Diagnosis not present

## 2018-05-07 DIAGNOSIS — N261 Atrophy of kidney (terminal): Secondary | ICD-10-CM | POA: Insufficient documentation

## 2018-05-07 DIAGNOSIS — M50222 Other cervical disc displacement at C5-C6 level: Secondary | ICD-10-CM | POA: Diagnosis not present

## 2018-05-07 DIAGNOSIS — M4802 Spinal stenosis, cervical region: Secondary | ICD-10-CM | POA: Diagnosis not present

## 2018-05-07 NOTE — Telephone Encounter (Signed)
I order EMG/NCS, please fit her into my slot soon,

## 2018-05-07 NOTE — Telephone Encounter (Signed)
Please call patient, MRI cervical and lumbar spine showed mild degenerative changes, no significant abnormalities noted.   MRI CERVICAL SPINE IMPRESSION:  1. Small central disc protrusion at C5-6 with resultant mild spinal stenosis without cord impingement. 2. Otherwise unremarkable and normal MRI of the cervical spine. Normal MRI appearance of the cervical spinal cord.  MRI LUMBAR SPINE IMPRESSION:  1. 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 2. Chronic left renal atrophy. 3. Otherwise unremarkable MRI of the lumbar spine. No significant disc or facet pathology. No stenosis or neural impingement.

## 2018-05-07 NOTE — Telephone Encounter (Signed)
I spoke with Sharon Duncan, patient's boyfriend, ok per dpr. He is aware of MRI results and is wondering what the next step is. He states that the patient is still doing her therapy, but she's still unable to stand unassisted. She has a follow up OV scheduled for 05/24/18. Is there anything else that can be done before then?

## 2018-05-08 NOTE — Telephone Encounter (Signed)
I called pt, spoke with her boyfriend Hart per DPR. I advised him that Dr. Terrace Arabia can do a nerve conduction study with pt on 05/14/18. The appt is at 9:00 with the technician and then Dr. Terrace Arabia will see her between pt's after that but it may be a little wait. Pt's boyfriend verbalized understanding and is agreeable to this appt date and time.

## 2018-05-08 NOTE — Telephone Encounter (Signed)
I spoke to Dr. Terrace Arabia. Sharon Duncan has an opening at 9:00am on Monday. Dr. Terrace Arabia will see the pt between her patients. Patient may need to wait a while. If this does not work, pt should keep her 05/24/18 appt.

## 2018-05-08 NOTE — Telephone Encounter (Signed)
I spoke to [facility] and he is also aware of this plan.

## 2018-05-08 NOTE — Telephone Encounter (Signed)
Put her on my schedule at Oct 18th at 12:45pm for emg/ncs

## 2018-05-09 ENCOUNTER — Other Ambulatory Visit: Payer: Self-pay

## 2018-05-14 ENCOUNTER — Ambulatory Visit (INDEPENDENT_AMBULATORY_CARE_PROVIDER_SITE_OTHER): Payer: Medicaid Other | Admitting: Neurology

## 2018-05-14 ENCOUNTER — Ambulatory Visit: Payer: Medicaid Other | Admitting: Neurology

## 2018-05-14 DIAGNOSIS — Z0289 Encounter for other administrative examinations: Secondary | ICD-10-CM

## 2018-05-14 DIAGNOSIS — R531 Weakness: Secondary | ICD-10-CM

## 2018-05-14 DIAGNOSIS — G729 Myopathy, unspecified: Secondary | ICD-10-CM

## 2018-05-14 NOTE — Procedures (Signed)
Full Name: Sharon Duncan Gender: Female MRN #: [id number] Date of Birth: 1991-02-09    Visit Date: 05/14/2018 09:19 Age: 27 Years 10 Months Old Examining Physician: Levert Feinstein, MD  Referring Physician: Terrace Arabia, MD History: 27 year old female presenting with bilateral lower extremity since postpartum in January 26, 2018, also complicated by acute pancreatitis, intractable nausea vomiting, require prolonged treatment, she also has mild bilateral paresthesia, hypersensitivity, slow recovery over the past couple months.  Summary of the tests: Bilateral sural sensory responses showed normal peak latency, with mildly decreased snap amplitude.  Left superficial peroneal sensory responses was absent.  Right superficial peroneal sensory responses showed mildly prolonged peak latency, low normal range snap amplitude.  Right median and ulnar sensory motor responses were normal.  Bilateral tibial motor responses were normal.  Right peroneal to EDB motor responses were normal.  Left peroneal to EDB motor response showed mildly prolonged distal latency, moderately decreased the C map amplitude, moderate slow conduction velocity.  Electromyography:  Selective needle examinations were performed at right lower extremity, right upper extremity muscles, right lumbosacral paraspinal muscles.   The most noticeable abnormalities at the right vastus lateralis, profound increased insertional activity, 3+ spontaneous activity, decreased number of motor unit potential, small polyphasic with early recruitment patterns.  Similar but milder neuropathic findings involving right tibialis anterior, right medial gastrocnemius.  Needle examination of right iliopsoas muscles and right gluteus medius muscles only showed mild increased insertional activity, fairly normal motor unit potentials, with fairly normal recruitment patterns.  Needle examination of the lumbar paraspinal muscles were normal.  There was no significant  abnormality on the needle examination of right upper extremity muscles.   Conclusion: This is an abnormal study.  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    ------------------------------- Levert Feinstein, M.D. PhD.  [facility] [address] [place], [place] [address] Tel: [phone] Fax: [phone]        [facility]    Nerve / Sites Muscle Latency Ref. Amplitude Ref. Rel Amp Segments Distance Velocity Ref. Area    ms ms mV mV %  cm m/s m/s mVms  R Median - APB     Wrist APB 2.7 ?4.4 12.5 ?4.0 100 Wrist - APB 7   45.2     Upper arm APB 6.9  11.5  92.3 Upper arm - Wrist 21 50 ?49 40.9  R Ulnar - ADM     Wrist ADM 2.6 ?3.3 9.2 ?6.0 100 Wrist - ADM 7   27.0     B.Elbow ADM 6.6  9.2  99.9 B.Elbow - Wrist 20 50 ?49 26.8     A.Elbow ADM 8.3  8.7  94.5 A.Elbow - B.Elbow 10 58 ?49 26.2         A.Elbow - Wrist      R Peroneal - EDB     Ankle EDB 5.3 ?6.5 5.8 ?2.0 100 Ankle - EDB 9   24.3     Fib head EDB 12.3  5.6  96.2 Fib head - Ankle 31 44 ?44 23.8     Pop fossa EDB 14.6  5.3  95.6 Pop fossa - Fib head 10 45 ?44 23.1         Pop fossa - Ankle      L Peroneal - EDB     Ankle EDB  6.5 ?6.5 0.7 ?2.0 100 Ankle - EDB 9   1.8     Fib head EDB 16.1  0.7  93.9 Fib head - Ankle 31 32 ?44 1.8     Pop fossa EDB 18.8  0.2  25.4 Pop fossa - Fib head 10 37 ?44 0.3         Pop fossa - Ankle      R Tibial - AH     Ankle AH 4.4 ?5.8 12.5 ?4.0 100 Ankle - AH 9   36.3     Pop fossa AH 13.3  12.5  99.9 Pop fossa - Ankle 38 43 ?41 36.9  L Tibial - AH     Ankle AH 5.1 ?5.8 11.6 ?4.0 100 Ankle - AH 9   34.6     Pop fossa AH 14.3  8.6  74.3 Pop fossa - Ankle 38 41 ?41 26.5                  SNC    Nerve / Sites Rec. Site Peak Lat Ref.  Amp Ref. Segments Distance    ms ms V V  cm    R Sural - Ankle (Calf)     Calf Ankle 3.2 ?4.4 5 ?6 Calf - Ankle 14  L Sural - Ankle (Calf)     Calf Ankle 3.9 ?4.4 5 ?6 Calf - Ankle 14  R Superficial peroneal - Ankle     Lat leg Ankle 4.8 ?4.4 6 ?6 Lat leg - Ankle 14  L Superficial peroneal - Ankle     Lat leg Ankle NR ?4.4 NR ?6 Lat leg - Ankle 14  R Median - Orthodromic (Dig II, Mid palm)     Dig II Wrist 2.9 ?3.4 11 ?10 Dig II - Wrist 13  R Ulnar - Orthodromic, (Dig V, Mid palm)     Dig V Wrist 2.6 ?3.1 19 ?5 Dig V - Wrist 65                 F  Wave    Nerve F Lat Ref.   ms ms  R Tibial - AH 50.0 ?56.0  L Tibial - AH 51.1 ?56.0  R Ulnar - ADM 28.5 ?32.0                   EMG full       EMG Summary Table    Spontaneous MUAP Recruitment  Muscle IA Fib PSW Fasc Other Amp Dur. Poly Pattern  R. Tibialis anterior Increased 3+ 3+ None _______ Decreased Increased 3+ Early  R. Gastrocnemius (Medial head) Increased 2+ 2+ None _______ Increased Increased Normal Early  R. Vastus lateralis Increased 3+ 3+ None _______ Increased Increased 2+ Early  R. Iliopsoas Increased None None None _______ Increased Increased Normal Normal  R. Gluteus medius Normal None None None _______ Normal Normal Normal Normal  R. Lumbar paraspinals (mid) Normal None None None _______ Normal Normal Normal Normal  R. Lumbar paraspinals (low) Normal None None None _______ Normal Normal Normal Normal  R. First dorsal interosseous Normal None None None _______ Normal Normal Normal Normal  R. Deltoid Normal None None None _______ Normal Normal Normal Normal  R. Biceps brachii Normal None None None _______ Normal Normal Normal Normal

## 2018-05-14 NOTE — Progress Notes (Signed)
PATIENT: Sharon Duncan DOB: May 14, 1991  No chief complaint on file.    HISTORICAL  Yasheka Fossett is a 27 years old female, seen in request by her primary care physician Dr. Otilio Miu, Marisa Cyphers for evaluation of numbness, initial evaluation was on April 24, 2018.  She is accompanied by her husband Caleb at today's visit.  I have reviewed and summarized the referring note from the referring physician, multiple hospital discharge summary.  She was previously healthy, at the end of her most recent pregnancy, around May 2019, she developed symptoms suggestive of vasculitis, resemble hand-foot and mouth rash, symptoms have resolved, she had normal vaginal delivery on January 26, 2018 with healthy girl, the same day, she developed significant abdominal pain, nausea vomiting, was diagnosed with pancreatitis associated with gallstone, she had a cholecystectomy 2 weeks afterwards, but had multiple hospital admission for persistent nausea, vomiting, most recent hospital admission was on March 17, 2018,  CT of abdomen showed circumcised collection arising from the body and tail of pancreas, concerning for abscess/leak, necrosis, MRI of the abdomen suggestive of developing pseudocyst, less suspicious for abscess, was seen by GI and the surgeon, no intervention is needed, she also has decreased p.o. intake, was given prescription of Reglan,  She also had hospital admission on February 08, 2018, for persistent nausea, vomiting decreased p.o. intake,  Since her most recent hospital discharge on March 17, 2018, she developed numbness from lower abdominal to bilateral lower extremity, bilateral feet feel like burning blister, refused for me to touch her feet because of pain, she denies rash.  She has mild gait abnormality generalized weakness since her delivery, dealing with pancreatitis, but had much worsened since March 17, 2018, now cleaning with a wheelchair,  I personally reviewed MRI of thoracic spine in  September 2019, there was no acute abnormality. MRI of the brain that was normal  Laboratory evaluations on February 13, 2018, hemoglobin of 9.1, BMP showed elevated creatinine of 1.42, GFR was 51, UA showed hazy, elevated protein, normal TSH, 1.4, A1c was mildly elevated 6.25 March 2018 showed creatinine of 1.33, GFR of 49, CPK was 21, TSH was 2.4, lipase was 46 6, vitamin B12 was 523, vitamin D was 10.2,  She had complains of generalized weakness, but able to use her upper extremities without significant difficulty, able to roll her wheelchair, she denies neck pain, low back pain, denies bowel and bladder incontinence.  Denies visual loss.  UPDATE May 14 2018: Patient return for electrodiagnostic study today, which showed significant myelopathic changes involving right vastus lateralis, rectus femoris, tibialis anterior, medial gastrocnemius, but not more proximal muscles such as iliopsoas, gluteus medius, lumbar paraspinal muscles, there is also evidence of mild axonal sensorimotor polyneuropathy.  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  Only mild sensory loss decreased vibratory sensation at toes,  She began to symptomatic about a month after she gave birth on January 26, 2018, when she was treated with acute pancreatitis, but she presented with nausea, vomiting since May 2019, which progressed over the next few months, at the of her weakness, in August 2019, she was still weak, she could not sit up, she could not turning back, now she can sit up without support, still has difficulty turning, cannot ambulate   REVIEW OF SYSTEMS: Full 14 system review of systems performed and  notable only for as above All other review of systems were negative.  ALLERGIES: Allergies  Allergen  Reactions  . Rocephin [Ceftriaxone] Rash    HOME MEDICATIONS: Current Outpatient Medications  Medication Sig Dispense Refill  . ferrous sulfate 325 (65 FE) MG tablet Take 325 mg by mouth daily.    Marland Kitchen gabapentin (NEURONTIN) 300 MG capsule Take 1 capsule (300 mg total) by mouth 3 (three) times daily. 90 capsule 11  . metoCLOPramide (REGLAN) 10 MG tablet Take 1 tablet (10 mg total) by mouth 3 (three) times daily with meals. 30 tablet 0  . metoprolol succinate (TOPROL-XL) 25 MG 24 hr tablet Take by mouth.    . OXcarbazepine (TRILEPTAL) 150 MG tablet Take 1 tablet (150 mg total) by mouth 2 (two) times daily. 60 tablet 11  . oxyCODONE-acetaminophen (PERCOCET) 10-325 MG tablet Take by mouth.    . progesterone (PROMETRIUM) 200 MG capsule START TONIGHT ONE CAPSULE, THEN ONE CAPSULE FOUR TIMES DAILY FOR SEVEN DAYS, 2ND WEEK ONE CAPSULE THREE TIMES DAILY, 3RD WEEK ONE CAPSULE TW    . venlafaxine XR (EFFEXOR-XR) 75 MG 24 hr capsule Take by mouth.    . Vitamin D, Ergocalciferol, (DRISDOL) 50000 units CAPS capsule Take by mouth.     No current facility-administered medications for this visit.     PAST MEDICAL HISTORY: Past Medical History:  Diagnosis Date  . CKD (chronic kidney disease), stage III (HCC) 03/10/2018  . Depression   . Frequent UTI   . Hypertension   . Obesity hypoventilation syndrome (HCC) 03/10/2018  . Pancreatitis due to common bile duct stone   . Paresthesia   . Vitamin D deficiency     PAST SURGICAL HISTORY: Past Surgical History:  Procedure Laterality Date  . CHOLECYSTECTOMY    . KIDNEY SURGERY  2004   ?to lift kidney? d/t frequent UTIs  . TOOTH EXTRACTION N/A 03/03/2014   Procedure: EXTRACTION OF WISDOM TEETH;  Surgeon: Georgia Lopes, DDS;  Location[facility] OR;  Service: Oral Surgery;  Laterality: N/A;    FAMILY HISTORY: Family History  Problem Relation Age of Onset  . Lung disease Mother   . Other Father        died in car accident when patient was 27 years old     SOCIAL HISTORY: Social History   Socioeconomic History  . Marital status: Single    Spouse name: Not on file  . Number of children: 4  . Years of education: some college  . Highest education level: Some college, no degree  Occupational History  . Occupation: stay at home mom  Social Needs  . Financial resource strain: Somewhat hard  . Food insecurity:    Worry: Never true    Inability: Never true  . Transportation needs:    Medical: No    Non-medical: No  Tobacco Use  . Smoking status: Never Smoker  . Smokeless tobacco: Never Used  Substance and Sexual Activity  . Alcohol use: Yes    Comment: Rare  . Drug use: No  . Sexual activity: Yes    Birth control/protection: Condom  Lifestyle  . Physical activity:    Days per week: 1 day    Minutes per session: 30 min  . Stress: Not at all  Relationships  . Social connections:    Talks on phone: Once a week    Gets together: Once a week    Attends religious service: More than 4 times per year    Active member of club  or organization: No    Attends meetings of clubs or organizations: Never    Relationship status: Living with partner  . Intimate partner violence:    Fear of current or ex partner: No    Emotionally abused: No    Physically abused: No    Forced sexual activity: No  Other Topics Concern  . Not on file  Social History Narrative   Lives at home with husband and four children.   Right-handed.   No daily caffeine use.     PHYSICAL EXAM   There were no vitals filed for this visit.  Not recorded      There is no height or weight on file to calculate BMI.  PHYSICAL EXAMNIATION:  Gen: NAD, conversant, well nourised, obese, well groomed                     Cardiovascular: Regular rate rhythm, no peripheral edema, warm, nontender. Eyes: Conjunctivae clear without exudates or hemorrhage Neck: Supple, no carotid bruits. Pulmonary: Clear to auscultation bilaterally   NEUROLOGICAL EXAM:  MENTAL  STATUS: Speech:    Speech is normal; fluent and spontaneous with normal comprehension.  Cognition:     Orientation to time, place and person     Normal recent and remote memory     Normal Attention span and concentration     Normal Language, naming, repeating,spontaneous speech     Fund of knowledge   CRANIAL NERVES: CN II: Visual fields are full to confrontation. Pupils are round equal and briskly reactive to light. CN III, IV, VI: extraocular movement are normal. No ptosis. CN V: Facial sensation is intact to pinprick in all 3 divisions bilaterally. Corneal responses are intact.  CN VII: Face is symmetric with normal eye closure and smile. CN VIII: Hearing is normal to rubbing fingers CN IX, X: Palate elevates symmetrically. Phonation is normal. CN XI: Head turning and shoulder shrug are intact CN XII: Tongue is midline with normal movements and no atrophy.  MOTOR: She has no neck flexion weakness, no significant bilateral upper extremity proximal muscle weakness, mild bilateral hand grip weakness,  R/L, hip flexion4/4,  knee flexion4-/4-, knee extension 3/3, ankle dorsiflexion4/4, ankle plantarflexion4/4.  Right hip abduction 4  REFLEXES: Reflexes are 2+ and symmetric at the biceps, triceps, absent at knees and ankles SENSORY:  Length dependent decreased light touch, vibratory sensation prick to bilateral ankle level   COORDINATION: Rapid alternating movements and fine finger movements are intact. There is no dysmetria on finger-to-nose   GAIT/STANCE: Deferred  DIAGNOSTIC DATA (LABS, IMAGING, TESTING) - I reviewed patient records, labs, notes, testing and imaging myself where available.   ASSESSMENT AND PLAN  Brendalee Matthies is a 27 y.o. female   Subacute onset of bilateral lower extremity weakness with mild sensory loss in the setting of critically ill with intractable nausea vomiting, acute pancreatitis, postpartum since July 2019,  Very slow recovery since it  subacute onset, peak was at August 2019, a month after her critical illness.  MRI of the brain, cervical, thoracic, lumbar spine failed to explain her profound weakness,  Significant myopathic changes on needle examination, also evidence of mild peripheral neuropathy  Most consistent with critical illness myopathy, and neuropathy  Will refer her to left vastus lateralis muscle biopsy, left sural nerve biopsy  Continue aggressive active and passive physical rehabilitation     Levert Feinstein, M.D. Ph.D.  [facility] [address] [place], [place] [address] Ph: [phone] Fax: [phone]  [phone]  CC:  Kela Millin, MD

## 2018-05-15 NOTE — Telephone Encounter (Signed)
Pts boyfriend Wilber Oliphant requesting a call to discuss in detail the MRI and EMG/NCV please advise

## 2018-05-16 LAB — CK: CK TOTAL: 39 U/L (ref 24–173)

## 2018-05-16 LAB — ANA W/REFLEX IF POSITIVE: Anti Nuclear Antibody(ANA): NEGATIVE

## 2018-05-16 LAB — COPPER, SERUM: Copper: 117 ug/dL (ref 72–166)

## 2018-05-16 LAB — VITAMIN D 25 HYDROXY (VIT D DEFICIENCY, FRACTURES): Vit D, 25-Hydroxy: 14.9 ng/mL — ABNORMAL LOW (ref 30.0–100.0)

## 2018-05-16 LAB — FERRITIN: FERRITIN: 342 ng/mL — AB (ref 15–150)

## 2018-05-16 NOTE — Telephone Encounter (Addendum)
Call patient, explained the findings to her, extensive laboratory evaluation showed low vitamin D 15, she is already on vitamin D supplement 50,000 units weekly, will potentially repeat laboratory evaluation later.  Rest of the laboratory evaluation showed no significant abnormality.  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

## 2018-05-24 ENCOUNTER — Ambulatory Visit: Payer: Medicaid Other | Admitting: Neurology

## 2018-05-29 NOTE — Telephone Encounter (Signed)
Called and left message for relaying I had called the [facility]  and spoke to Maralyn Sago about Referral and she is going to call . Patient today and get patient scheduled today for apt.

## 2018-05-29 NOTE — Telephone Encounter (Signed)
Pts boyfriend Wilber Oliphant requesting a call to discuss the pt referral for a muscle biopsy

## 2018-05-29 NOTE — Telephone Encounter (Signed)
noted 

## 2018-06-04 ENCOUNTER — Telehealth: Payer: Self-pay | Admitting: Neurology

## 2018-06-04 DIAGNOSIS — G729 Myopathy, unspecified: Secondary | ICD-10-CM

## 2018-06-04 NOTE — Telephone Encounter (Addendum)
I replace the order for left sural and vastus lateralis biopsy to neurosurgeon Dr. Jule Ser.  Message  Received: Today  Message Contents  Sharon Miyamoto, MD  Levert Feinstein, MD        I have reviewed her info   We do not do sural nerve biopsies and only do general muscle biopsies in the thigh and not specific vastis lateralis muscles.   She needs to be referred to [facility] to seen one of the Orthopedic Surgeons.   Thanks,   Carman Ching, MD  [facility]

## 2018-06-11 ENCOUNTER — Telehealth: Payer: Self-pay | Admitting: Neurology

## 2018-06-11 NOTE — Telephone Encounter (Signed)
Thursday  Apt 06/14/2018 arrive at 8:30 am for a 9:00 am . Paper work still needs to be done.   I had to tell tell Patient's boyfriend to calm his voice down while speaking to me because he got lost today going to apt. Apt was sch. 06/11/2018 - arrive at 8:30 for 9:00 am    I relayed to patient's boyfriend That I was trying to help him and please don't speak to me that way . Patient's boy friend apologized and Thanked me.

## 2018-07-02 ENCOUNTER — Telehealth: Payer: Self-pay | Admitting: Neurology

## 2018-07-02 NOTE — Telephone Encounter (Addendum)
RN spoke with pts husband Wilber OliphantCaleb. Rn stated per Dr.Yan the appt can be cancel being that pt will not get muscle biopsy till Wednesday.Rn stated a note will be sent to Angie so it will not be counted as a no show. Pts husband will call tomorrow to r/s. Rn stated we can call him to r/s with Dr. Terrace ArabiaYan. The husband verbalized understanding.

## 2018-07-02 NOTE — Telephone Encounter (Signed)
Rn call pt but she was unavailable. The husband stated he call last Thursday but no one call back. Rn stated the whole [facility] outpatient system phones and epic was down majority of the day. The phone calls were going to third party source. The husband stated he did not want to come to [place] two days in a row.They are using somebody car.The pt is not schedule for muscle biopsy until Wednesday. Rn tried to reschedule pt for after the biopsy but Dr. Terrace ArabiaYan did not have any appts available until after February 2020. Rn stated pt can be reschedule and put on the waiting list. The husband will call us back.

## 2018-07-02 NOTE — Telephone Encounter (Signed)
Rn spoke with Dr. Terrace ArabiaYan stating pt will not have muscle biopsy till Wednesday. Dr. Terrace ArabiaYan pt appt can be cancel for tomorrow and pt can call back to reschedule another appt.

## 2018-07-02 NOTE — Telephone Encounter (Signed)
Please call pt. regarding 12/10 appt. Pt was under the impression that Dr. Terrace ArabiaYan was to see the pt after the biopsy. Pt states biopsy is not scheduled til Wed 12/11 and she will like to know if she should keep the 12/10 appt or reschedule. Pt. Husband states they called the other day asking this and no one has yet responded.Please call pt at [phone] (H).

## 2018-07-03 ENCOUNTER — Ambulatory Visit: Payer: Medicaid Other | Admitting: Neurology

## 2018-07-04 ENCOUNTER — Encounter: Payer: Self-pay | Admitting: Neurology

## 2018-07-11 ENCOUNTER — Telehealth: Payer: Self-pay | Admitting: Neurology

## 2018-07-11 NOTE — Telephone Encounter (Signed)
Revised. 

## 2018-07-11 NOTE — Telephone Encounter (Signed)
I called [name] and they confirmed the biopsy was performed on 07/04/18 (by Dr. Franky Machoabbell).  The results are still pending.  I called and left Caleb a message (ok per DPR) giving him this update.  I also scheduled the patient a follow up with Dr. Terrace ArabiaYan on 08/08/17 at 1pm (they are aware to arrive early for check-in).  Our number was provided to call back, if they have any questions.

## 2018-07-11 NOTE — Telephone Encounter (Signed)
Pt husband(on D[employer]-[place], Wilber OliphantCaleb) has called stating it was mentioned maybe coming in for a 15 min. Consult to discuss the results of the biopsy, he is asking for a call to discuss how long will results take and when will pt be r/s to come in

## 2018-07-19 NOTE — Telephone Encounter (Signed)
I called [name] and they still do not have the results yet.  I called Sharon Duncan back to let him know this information.  We will reach out to [name].  He is aware that our office will call them once the results are received.  He was appreciative for the update.

## 2018-07-19 NOTE — Telephone Encounter (Signed)
Pt husband(on DPR-[employer], Wilber OliphantCaleb) has called asking if the results to the biopsy are available yet.  He states they were told the results would be available within 2 weeks.  Please call

## 2018-07-20 NOTE — Telephone Encounter (Signed)
[facility].  She informed me that the patient's biopsy results are not back yet.  She offered to reach out to the facility processing the biopsy for a status update.  I provided her with my name and number.  She will call me back when she has more information.

## 2018-07-26 NOTE — Telephone Encounter (Addendum)
I followed up with Sharon Duncan today at [facility].  She was still unable to locate results.  She transferred me to [facility] (ph: [phone]) and I spoke with Robin. She verified that the sample was collected on 07/04/18 and likely packaged for send out on 07/05/18.  It was sent to [facility]).  The pathologist over the case is Dr. Luisa Hart.  The results are still pending.  I called the patient and provided her with this update.

## 2018-08-02 NOTE — Telephone Encounter (Signed)
I spoke with [facility]. Results still pending. She sts. that Dr. Luisa Hart still has the case, b/c muscle biopsy is not back from [facility] yet. She will let Dr. Luisa Hart know we have called for the results again/fim

## 2018-08-02 NOTE — Telephone Encounter (Signed)
I called the patient to provide her with the information below.  She appreciated the update.

## 2018-08-07 ENCOUNTER — Encounter (HOSPITAL_COMMUNITY): Payer: Self-pay

## 2018-08-07 NOTE — Telephone Encounter (Signed)
The nerve biopsy results were received and reviewed by Dr. Terrace Arabia.  Per vo by Dr. 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  No treatable course identified.  The muscle biopsy results are still pending.  We will call once they are received. The patient should keep her pending appt on 08/22/2018.

## 2018-08-07 NOTE — Telephone Encounter (Signed)
Spoke with Dorathy Daft and per Dr. Terrace Arabia, explained that nerve bx. shows axonal degen. c/w nerve damage; no treatable cause; muscle bx. results still pending, but we are calling [facility] regularly to check on these results, and will call pt. as soon as we have them. She should keep pending 08/22/18 appt. with Dr.  Terrace Arabia. She verbalized understanding of same/fim

## 2018-08-08 ENCOUNTER — Ambulatory Visit: Payer: Self-pay | Admitting: Neurology

## 2018-08-09 ENCOUNTER — Telehealth: Payer: Self-pay | Admitting: Neurology

## 2018-08-09 NOTE — Telephone Encounter (Signed)
Reports new onset of red, itchy rash all over right leg. Present for two days.  No fever or other symptoms of infection.  She has been using OTC Benadryl and hydrocortisone cream with some relief.  Instructed her to call PCP for further evaluation.  She was in agreement with this plan.

## 2018-08-09 NOTE — Telephone Encounter (Signed)
Pt husband(on DPR-[employer], Wilber Oliphant) has called to report that since Biopsy the leg it was done on has a reoccurrence of bumps appearing, going away after using benadryl cream and or  Hydrocortisone.  Husband states its a quarter of a way up pt's leg.  Please call

## 2018-08-13 NOTE — Telephone Encounter (Addendum)
I called [facility] [phone]) today to check on status of muscle biopsy.  I spoke to Alinda Moneyobin Wells who stated she was unsure of when the results would be back.  She offered to get another message to Dr. Luisa HartPatrick requesting he follow up with Dr. Terrace ArabiaYan.  I provided Dr. Zannie CoveYan's mobile number to him to call back.  The patient has a pending appt with Dr. Terrace ArabiaYan. On 08/22/2018.

## 2018-08-15 DIAGNOSIS — Z0271 Encounter for disability determination: Secondary | ICD-10-CM

## 2018-08-22 ENCOUNTER — Encounter: Payer: Self-pay | Admitting: Neurology

## 2018-08-22 ENCOUNTER — Ambulatory Visit: Payer: Medicaid Other | Admitting: Neurology

## 2018-08-22 VITALS — BP 109/70 | HR 98 | Ht 64.0 in | Wt 231.0 lb

## 2018-08-22 DIAGNOSIS — R202 Paresthesia of skin: Secondary | ICD-10-CM

## 2018-08-22 DIAGNOSIS — R269 Unspecified abnormalities of gait and mobility: Secondary | ICD-10-CM

## 2018-08-22 DIAGNOSIS — R531 Weakness: Secondary | ICD-10-CM

## 2018-08-22 NOTE — Progress Notes (Signed)
PATIENT: Sharon Duncan DOB: November 04, 1990  Chief Complaint  Patient presents with  . Myopathy    She is here with her significant other, Caleb, to review her MRI and biopsy results.  She has improved and is now able to ambulate with a rolling walker.      HISTORICAL  Sharon Duncan is a 28 years old female, seen in request by her primary care physician Dr. Otilio Miu, Marisa Cyphers for evaluation of numbness, initial evaluation was on April 24, 2018.  She is accompanied by her husband Caleb at today's visit.  I have reviewed and summarized the referring note from the referring physician, multiple hospital discharge summary.  She was previously healthy, at the end of her most recent pregnancy, around May 2019, she developed symptoms suggestive of vasculitis, resemble hand-foot and mouth rash, symptoms have resolved, she had normal vaginal delivery on January 26, 2018 with healthy girl, the same day, she developed significant abdominal pain, nausea vomiting, was diagnosed with pancreatitis associated with gallstone, she had a cholecystectomy 2 weeks afterwards, but had multiple hospital admission for persistent nausea, vomiting, most recent hospital admission was on March 17, 2018,  CT of abdomen showed circumcised collection arising from the body and tail of pancreas, concerning for abscess/leak, necrosis, MRI of the abdomen suggestive of developing pseudocyst, less suspicious for abscess, was seen by GI and the surgeon, no intervention is needed, she also has decreased p.o. intake, was given prescription of Reglan,  She also had hospital admission on February 08, 2018, for persistent nausea, vomiting decreased p.o. intake,  Since her most recent hospital discharge on March 17, 2018, she developed numbness from lower abdominal to bilateral lower extremity, bilateral feet feel like burning blister, refused for me to touch her feet because of pain, she denies rash.  She has mild gait abnormality generalized  weakness since her delivery, dealing with pancreatitis, but had much worsened since March 17, 2018, now cleaning with a wheelchair,  I personally reviewed MRI of thoracic spine in September 2019, there was no acute abnormality. MRI of the brain that was normal  Laboratory evaluations on February 13, 2018, hemoglobin of 9.1, BMP showed elevated creatinine of 1.42, GFR was 51, UA showed hazy, elevated protein, normal TSH, 1.4, A1c was mildly elevated 6.25 March 2018 showed creatinine of 1.33, GFR of 49, CPK was 21, TSH was 2.4, lipase was 46 6, vitamin B12 was 523, vitamin D was 10.2,  She had complains of generalized weakness, but able to use her upper extremities without significant difficulty, able to roll her wheelchair, she denies neck pain, low back pain, denies bowel and bladder incontinence.  Denies visual loss.  UPDATE May 14 2018: Patient return for electrodiagnostic study today, which showed significant myelopathic changes involving right vastus lateralis, rectus femoris, tibialis anterior, medial gastrocnemius, but not more proximal muscles such as iliopsoas, gluteus medius, lumbar paraspinal muscles, there is also evidence of mild axonal sensorimotor polyneuropathy.  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  Only mild sensory loss decreased vibratory sensation at toes,  She began to symptomatic about a month after she gave birth on January 26, 2018, when she was treated with acute pancreatitis, but she presented with nausea, vomiting since May 2019, which progressed over the next few months, at the of her weakness, in August 2019, she was  still weak, she could not sit up, she could not turning back, now she can sit up without support, still has difficulty turning, cannot  ambulate  UPDATE Aug 22 2018: She has regained significant recovery over the past few months, no ambulate with a walker, sometimes short distance without any assistance, continue has numbness at bilateral feet, no longer has significant burning pain, she denies bowel and bladder incontinence,  We personally reviewed MRI of lumbar and cervical spine, mild degenerative changes no significant foraminal canal stenosis  Laboratory evaluation seen October 2019 showed low vitamin D 15, ferritin 342, normal CPK 39, negative ANA, B12, RPR, HIV, folic acid was low 2.4, copper,  Muscle biopsy result from July 04, 2018, no evidence of necrosis, visual cytosis or inflammation, scattered atrophic fibers involving both fiber types, electromyography, some area there is a lot of artifact of fixation with this portion of the mitochondria, no fat accumulation is seen, myofilament seems normal,  Right sural nerve biopsy showing mildly decreased myelinated axons, abundant axonal degeneration, no clear etiology identified, small inflammatory accumulation seen only in 1 area, not enough to make the diagnosis of inflammatory myopathy,   REVIEW OF SYSTEMS: Full 14 system review of systems performed and notable only for numbness All other review of systems were negative.  ALLERGIES: Allergies  Allergen Reactions  . Rocephin [Ceftriaxone] Rash    HOME MEDICATIONS: Current Outpatient Medications  Medication Sig Dispense Refill  . ferrous sulfate 325 (65 FE) MG tablet Take 325 mg by mouth daily.    Marland Kitchen gabapentin (NEURONTIN) 300 MG capsule Take 1 capsule (300 mg total) by mouth 3 (three) times daily. 90 capsule 11  . OXcarbazepine (TRILEPTAL) 150 MG tablet Take 1 tablet (150 mg total) by mouth 2 (two) times daily. 60 tablet 11  . venlafaxine XR (EFFEXOR-XR) 75 MG 24 hr capsule Take by mouth.     No current facility-administered medications for this visit.     PAST MEDICAL HISTORY: Past Medical History:   Diagnosis Date  . CKD (chronic kidney disease), stage III (HCC) 03/10/2018  . Depression   . Frequent UTI   . Hypertension   . Obesity hypoventilation syndrome (HCC) 03/10/2018  . Pancreatitis due to common bile duct stone   . Paresthesia   . Vitamin D deficiency     PAST SURGICAL HISTORY: Past Surgical History:  Procedure Laterality Date  . CHOLECYSTECTOMY    . KIDNEY SURGERY  2004   ?to lift kidney? d/t frequent UTIs  . TOOTH EXTRACTION N/A 03/03/2014   Procedure: EXTRACTION OF WISDOM TEETH;  Surgeon: Georgia Lopes, DDS;  Location: [facility] OR;  Service: Oral Surgery;  Laterality: N/A;    FAMILY HISTORY: Family History  Problem Relation Age of Onset  . Lung disease Mother   . Other Father        died in car accident when patient was 28 years old    SOCIAL HISTORY: Social History   Socioeconomic History  . Marital status: Single    Spouse name: Not on file  . Number of children: 4  . Years of education: some college  . Highest education level: Some college, no degree  Occupational History  . Occupation: stay at home mom  Social Needs  . Financial resource strain: Somewhat hard  . Food insecurity:    Worry: Never true    Inability: Never true  . Transportation needs:    Medical: No    Non-medical: No  Tobacco Use  . Smoking status: Never  Smoker  . Smokeless tobacco: Never Used  Substance and Sexual Activity  . Alcohol use: Yes    Comment: Rare  . Drug use: No  . Sexual activity: Yes    Birth control/protection: Condom  Lifestyle  . Physical activity:    Days per week: 1 day    Minutes per session: 30 min  . Stress: Not at all  Relationships  . Social connections:    Talks on phone: Once a week    Gets together: Once a week    Attends religious service: More than 4 times per year    Active member of club or organization: No    Attends meetings of clubs or organizations: Never    Relationship status: Living with partner  . Intimate partner violence:     Fear of current or ex partner: No    Emotionally abused: No    Physically abused: No    Forced sexual activity: No  Other Topics Concern  . Not on file  Social History Narrative   Lives at home with husband and four children.   Right-handed.   No daily caffeine use.     PHYSICAL EXAM   Vitals:   08/22/18 1306  BP: 109/70  Pulse: 98  Weight: 231 lb (104.8 kg)  Height: 5\' 4"  (1.626 m)    Not recorded      Body mass index is 39.65 kg/m.  PHYSICAL EXAMNIATION:  Gen: NAD, conversant, well nourised, obese, well groomed                     Cardiovascular: Regular rate rhythm, no peripheral edema, warm, nontender. Eyes: Conjunctivae clear without exudates or hemorrhage Neck: Supple, no carotid bruits. Pulmonary: Clear to auscultation bilaterally   NEUROLOGICAL EXAM:  MENTAL STATUS: Speech:    Speech is normal; fluent and spontaneous with normal comprehension.  Cognition:     Orientation to time, place and person     Normal recent and remote memory     Normal Attention span and concentration     Normal Language, naming, repeating,spontaneous speech     Fund of knowledge   CRANIAL NERVES: CN II: Visual fields are full to confrontation. Pupils are round equal and briskly reactive to light. CN III, IV, VI: extraocular movement are normal. No ptosis. CN V: Facial sensation is intact to pinprick in all 3 divisions bilaterally. Corneal responses are intact.  CN VII: Face is symmetric with normal eye closure and smile. CN VIII: Hearing is normal to rubbing fingers CN IX, X: Palate elevates symmetrically. Phonation is normal. CN XI: Head turning and shoulder shrug are intact CN XII: Tongue is midline with normal movements and no atrophy.  MOTOR: She has no neck flexion weakness, no significant bilateral upper extremity proximal or distal muscle weakness  She has no significant proximal muscle weakness, mild bilateral toe flexion extension weakness.  REFLEXES: Reflexes  are 2+ and symmetric at the biceps, triceps, absent at knees and ankles  SENSORY:  Length dependent decreased light touch, vibratory sensation prick to bilateral midfoot level   COORDINATION: Rapid alternating movements and fine finger movements are intact. There is no dysmetria on finger-to-nose   GAIT/STANCE: She needs pushed up to get up from seated position, wide-based, cautious, DIAGNOSTIC DATA (LABS, IMAGING, TESTING) - I reviewed patient records, labs, notes, testing and imaging myself where available.   ASSESSMENT AND PLAN  Sharon Duncan is a 28 y.o. female   Subacute onset of bilateral lower extremity  weakness with mild sensory loss in the setting of critically ill with intractable nausea vomiting, acute pancreatitis, postpartum since July 2019,  Slow recovery since it subacute onset, peak was at August 2019, a month after her critical illness.  MRI of the brain, cervical, thoracic, lumbar spine failed to explain her profound weakness,  Significant myopathic changes on needle examination, also evidence of mild peripheral neuropathy  Most consistent with critical illness myopathy, and neuropathy  Muscle and nerve biopsy showed no significant process,  Continue aggressive active and passive physical rehabilitation  Gabapentin and Trileptal for neuropathic pain,  Vitamin D and folic acid supplement     Levert Feinstein, M.D. Ph.D.  [facility] [address] [place], [place] [address] Ph: [phone] Fax: [phone]  CC:  Kela Millin, MD

## 2018-11-14 DIAGNOSIS — Z0289 Encounter for other administrative examinations: Secondary | ICD-10-CM

## 2018-12-06 ENCOUNTER — Ambulatory Visit (INDEPENDENT_AMBULATORY_CARE_PROVIDER_SITE_OTHER): Payer: Medicaid Other | Admitting: Neurology

## 2018-12-06 ENCOUNTER — Other Ambulatory Visit: Payer: Self-pay

## 2018-12-06 ENCOUNTER — Encounter: Payer: Self-pay | Admitting: Neurology

## 2018-12-06 DIAGNOSIS — R531 Weakness: Secondary | ICD-10-CM | POA: Diagnosis not present

## 2018-12-06 DIAGNOSIS — R269 Unspecified abnormalities of gait and mobility: Secondary | ICD-10-CM

## 2018-12-06 DIAGNOSIS — G729 Myopathy, unspecified: Secondary | ICD-10-CM

## 2018-12-06 NOTE — Progress Notes (Signed)
PATIENT: Sharon Duncan DOB: Sep 29, 1990    HISTORICAL  Sharon Duncan is a 28 years old female, seen in request by her primary care physician Dr. Otilio Miu, Marisa Cyphers for evaluation of numbness, initial evaluation was on April 24, 2018.  She is accompanied by her husband Sharon Duncan.  I have reviewed and summarized the referring note from the referring physician, multiple hospital discharge summary.  She was previously healthy, at the end of her most recent pregnancy, around May 2019, she developed symptoms suggestive of vasculitis, resemble hand-foot and mouth rash, symptoms have resolved, she had normal vaginal delivery on January 26, 2018 with healthy girl, the same day, she developed significant abdominal pain, nausea vomiting, was diagnosed with pancreatitis associated with gallstone, she had a cholecystectomy 2 weeks afterwards, but had multiple hospital admission for persistent nausea, vomiting, most recent hospital admission was on March 17, 2018,  CT of abdomen showed circumcised collection arising from the body and tail of pancreas, concerning for abscess/leak, necrosis, MRI of the abdomen suggestive of developing pseudocyst, less suspicious for abscess, was seen by GI and the surgeon, no intervention is needed, she also has decreased p.o. intake, was given prescription of Reglan,  She also had hospital admission on February 08, 2018, for persistent nausea, vomiting decreased p.o. intake,  Since her most recent hospital discharge on March 17, 2018, she developed numbness from lower abdominal to bilateral lower extremity, bilateral feet feel like burning blister, refused for me to touch her feet because of pain, she denies rash.  She has mild gait abnormality generalized weakness since her delivery, dealing with pancreatitis, but had much worsened since March 17, 2018, now cleaning with a wheelchair,  I personally reviewed MRI of thoracic spine in September 2019, there was no acute  abnormality. MRI of the brain that was normal  Laboratory evaluations on February 13, 2018, hemoglobin of 9.1, BMP showed elevated creatinine of 1.42, GFR was 51, UA showed hazy, elevated protein, normal TSH, 1.4, A1c was mildly elevated 6.25 March 2018 showed creatinine of 1.33, GFR of 49, CPK was 21, TSH was 2.4, lipase was 46 6, vitamin B12 was 523, vitamin D was 10.2,  She had complains of generalized weakness, but able to use her upper extremities without significant difficulty, able to roll her wheelchair, she denies neck pain, low back pain, denies bowel and bladder incontinence.  Denies visual loss.  UPDATE May 14 2018: Patient return for electrodiagnostic study today, which showed significant myelopathic changes involving right vastus lateralis, rectus femoris, tibialis anterior, medial gastrocnemius, but not more proximal muscles such as iliopsoas, gluteus medius, lumbar paraspinal muscles, there is also evidence of mild axonal sensorimotor polyneuropathy.  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  Only mild sensory loss decreased vibratory sensation at toes,  She began to symptomatic about a month after she gave birth on January 26, 2018, when she was treated with acute pancreatitis, but she presented with nausea, vomiting since May 2019, which progressed over the next few months, at the of her weakness, in August 2019, she was still weak, she could not sit up, she could not turning back, now she can sit up without support, still has difficulty turning, cannot ambulate  UPDATE Aug 22 2018: She has regained significant recovery over the past few months, no ambulate with a  walker, sometimes short distance without any assistance, continue has numbness at bilateral feet, no longer has  significant burning pain, she denies bowel and bladder incontinence,  We personally reviewed MRI of lumbar and cervical spine, mild degenerative changes no significant foraminal canal stenosis  Laboratory evaluation seen October 2019 showed low vitamin D 15, ferritin 342, normal CPK 39, negative ANA, B12, RPR, HIV, folic acid was low 2.4, copper,  Muscle biopsy result from July 04, 2018, no evidence of necrosis, visual cytosis or inflammation, scattered atrophic fibers involving both fiber types, electromyography, some area there is a lot of artifact of fixation with this portion of the mitochondria, no fat accumulation is seen, myofilament seems normal,  Right sural nerve biopsy showing mildly decreased myelinated axons, abundant axonal degeneration, no clear etiology identified, small inflammatory accumulation seen only in 1 area, not enough to make the diagnosis of inflammatory myopathy,  Virtual Duncan via Video  I connected with Sharon Duncan at  by Video and verified that I am speaking with the correct person using two identifiers.   I discussed the limitations, risks, security and privacy concerns of performing an evaluation and management service by video and the availability of in person appointments. I also discussed with the patient that there may be a patient responsible charge related to this service. The patient expressed understanding and agreed to proceed.   History of Present Illness: She can walk better, she has no bowel and bladder incontinence, she still has numbness, tingling at her feet, she has no finger paresthesia.   Observations/Objective: I have reviewed problem lists, medications, allergies.  She has mildly unsteady gait, cautious, difficulty with tandem, tip toe and heel walking  Assessment and Plan:  Sharon Duncan is a 28 y.o. female   Subacute onset of bilateral lower extremity weakness with mild sensory loss in the setting of critically ill  with intractable nausea vomiting, acute pancreatitis, postpartum since July 2019,  Slow recovery since it subacute onset, peak was on August 2019, a month after her critical illness.  MRI of the brain, cervical, thoracic, lumbar spine failed to explain her profound weakness,  Significant myopathic changes on needle examination, also evidence of mild peripheral neuropathy  Most consistent with critical illness myopathy, and neuropathy  Muscle and nerve biopsy showed no significant inflammatory process,  Continue aggressive active and passive physical rehabilitation  Gabapentin and Trileptal for neuropathic pain,  Vitamin D and folic acid supplement  Follow Up Instructions:   6 months   I discussed the assessment and treatment plan with the patient. The patient was provided an opportunity to ask questions and all were answered. The patient agreed with the plan and demonstrated an understanding of the instructions.   The patient was advised to call back or seek an in-person evaluation if the symptoms worsen or if the condition fails to improve as anticipated.  I provided 25 minutes of non-face-to-face time during this encounter.   Levert FeinsteinYijun Arlow Spiers, MD

## 2019-02-21 ENCOUNTER — Ambulatory Visit: Payer: Medicaid Other | Admitting: Neurology

## 2019-08-04 IMAGING — DX DG CHEST 1V
1 series · 1 of 1 positions shown · non-contrast
Comparison: None.

CLINICAL DATA: Fever.

EXAM:
CHEST  1 VIEW

[chest ap]
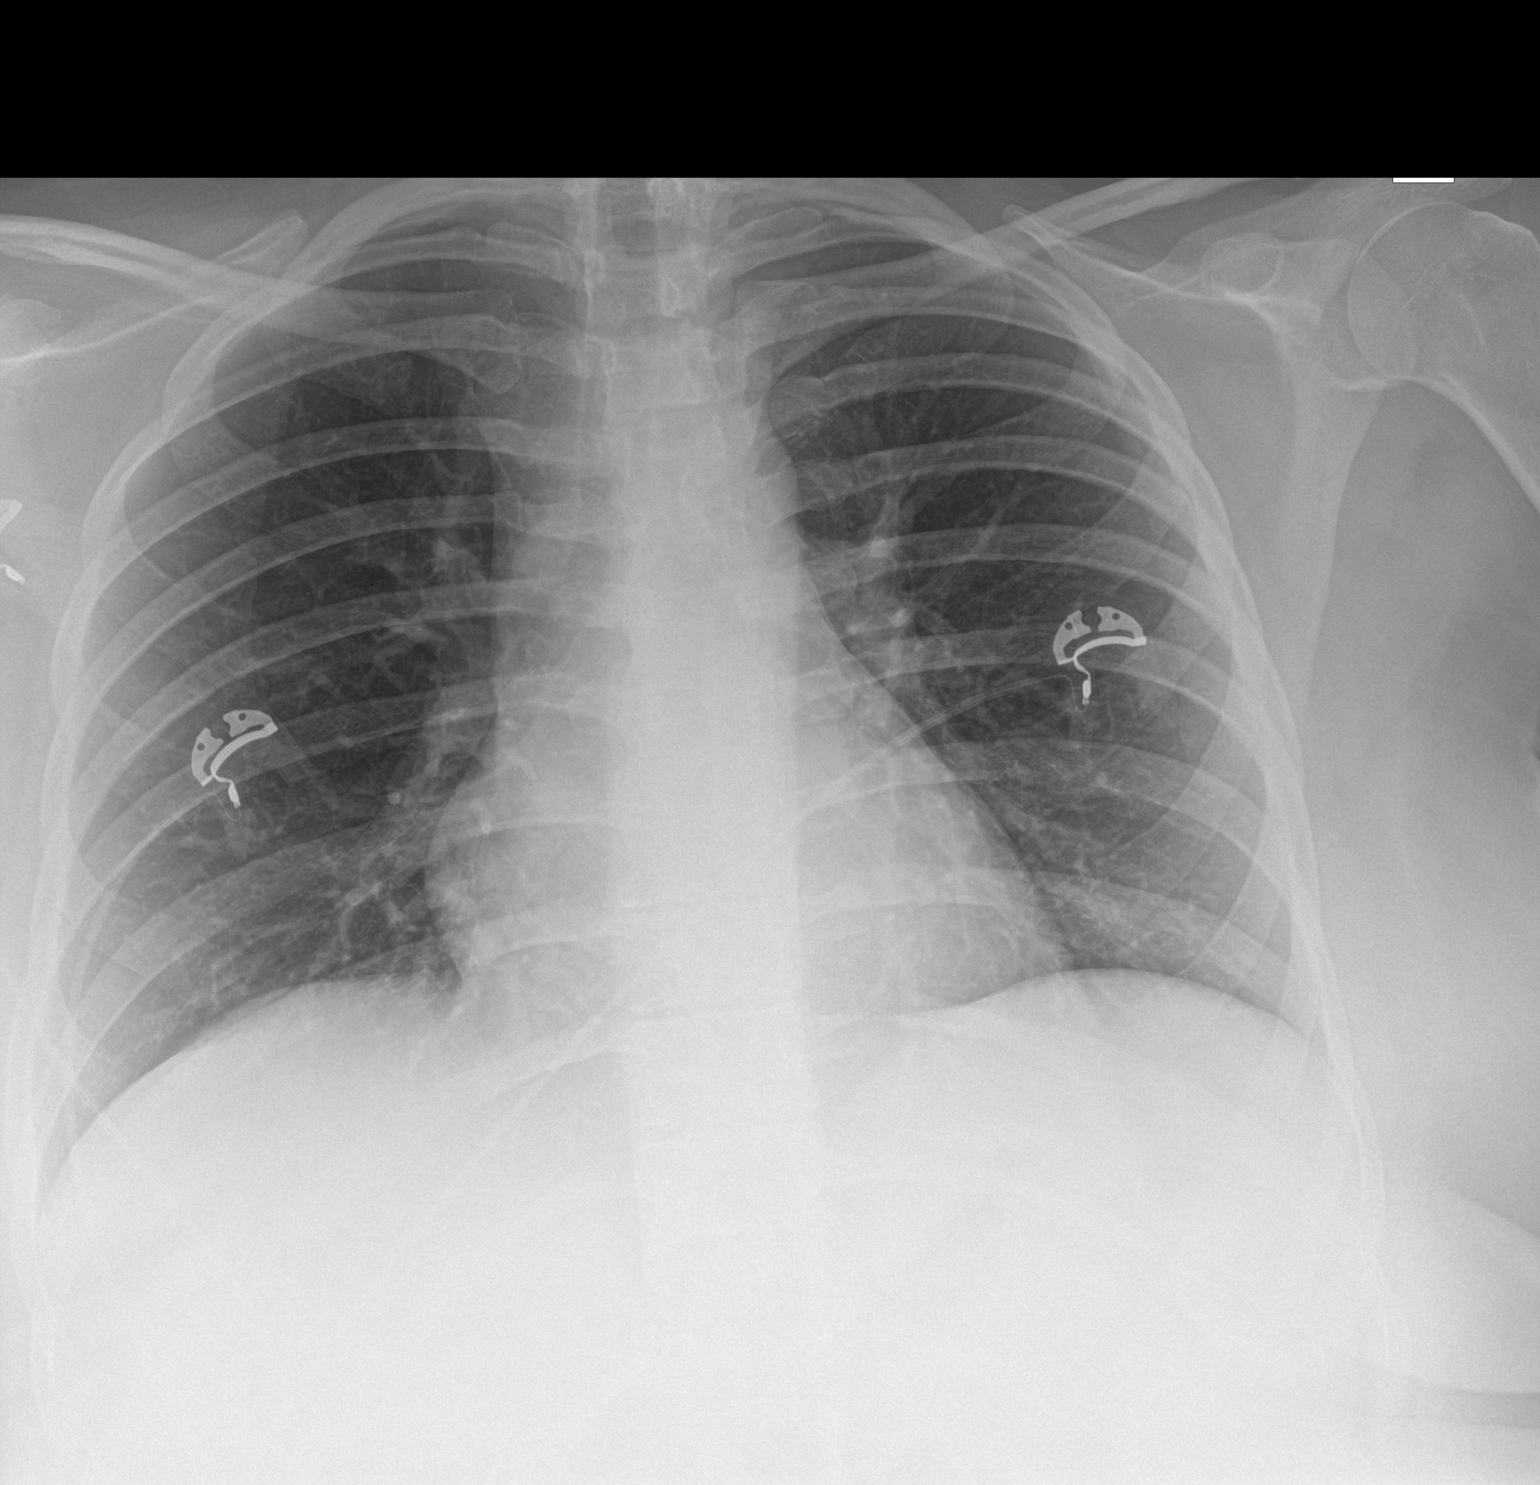

[1 of 1 positions shown; findings below may reference images not displayed]

FINDINGS: Lungs clear. Heart size normal. No pneumothorax or pleural fluid. No
acute or focal bony abnormality.
IMPRESSION: Negative chest.

## 2019-09-26 IMAGING — MR MR LUMBAR SPINE W/O CM
4 of 6 series · 17 of 48 positions shown · non-contrast
Comparison: None available.

CLINICAL DATA: Initial evaluation for progressive weakness with
cerebellar ataxia status post recent pregnancy.

EXAM:
MRI CERVICAL AND LUMBAR SPINE WITHOUT CONTRAST
TECHNIQUE: Multiplanar and multiecho pulse sequences of the cervical spine, to
include the craniocervical junction and cervicothoracic junction,
and lumbar spine, were obtained without intravenous contrast.

[Series 3: T2 · sagittal · 4.0mm · 0.55mm/px · 5 of 13 slices shown (1 of 2)]
[im 1/13]
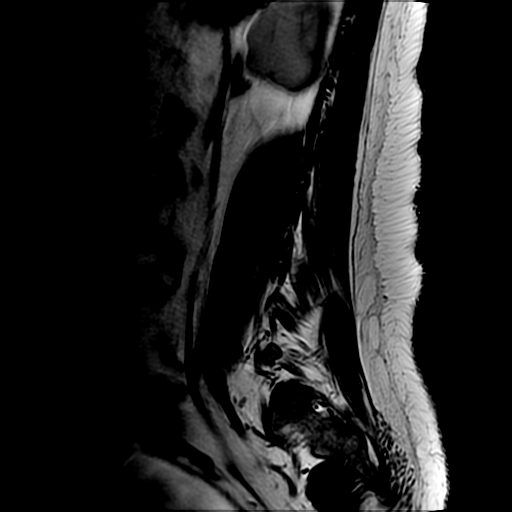
[im 4/13]
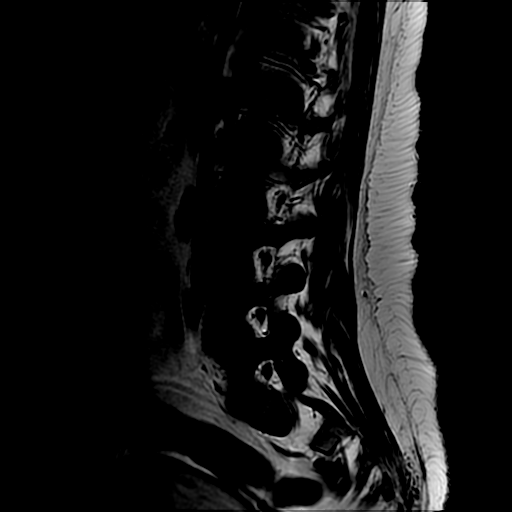
[im 7/13]
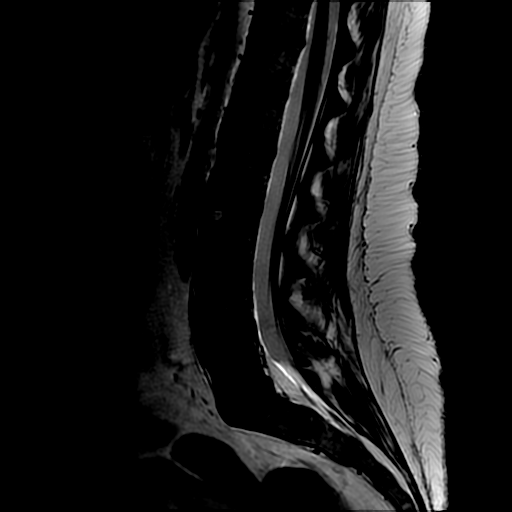
[im 10/13]
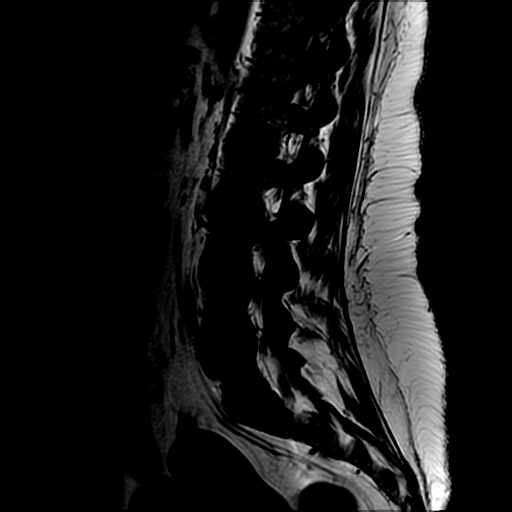
[im 13/13]
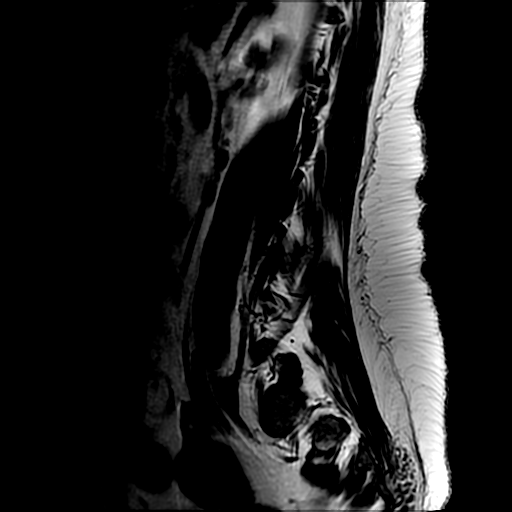

[Series 5: T1 · sagittal · 4.0mm · 0.55mm/px · 3 of 13 slices shown (1 of 2)]
[im 1/13]
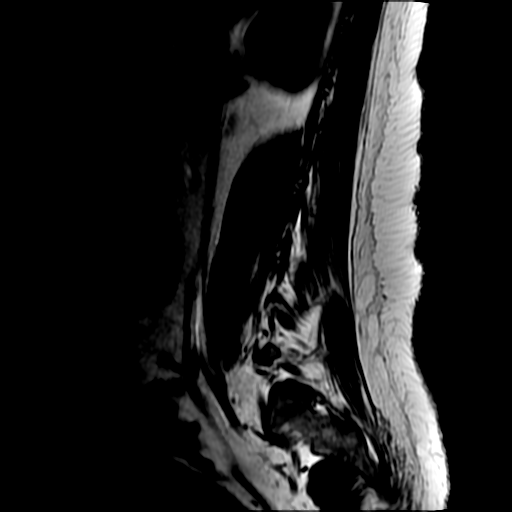
[im 7/13]
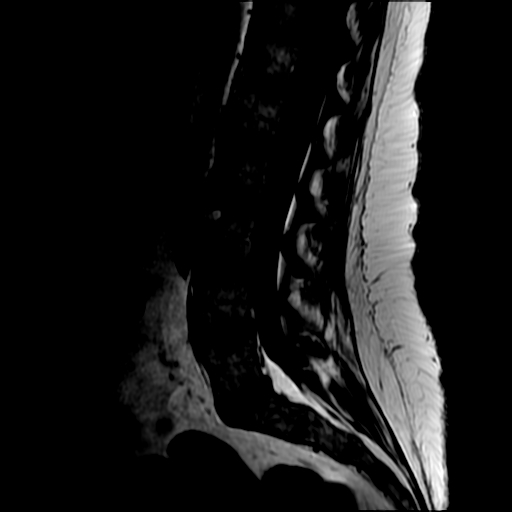
[im 13/13]
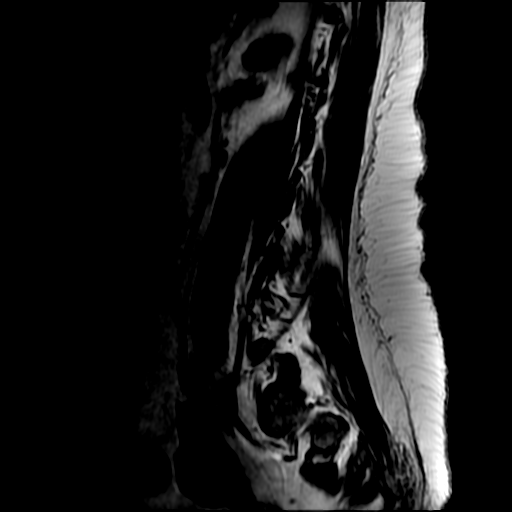

[Series 7: T2 · axial · 4.0mm · 0.39mm/px · z∈[-66,+94]mm · 6 of 34 slices shown (2 of 2)]
[im 1/34]
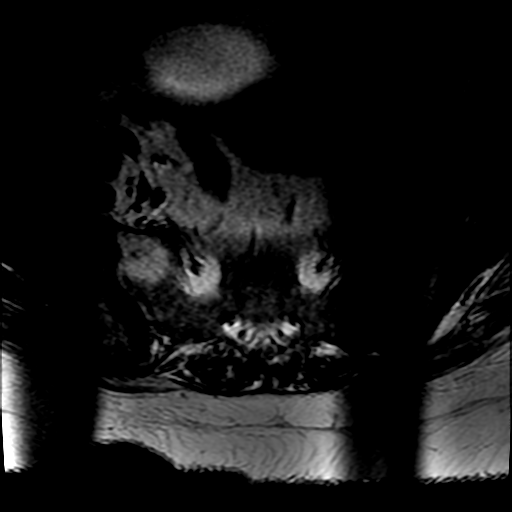
[im 6/34]
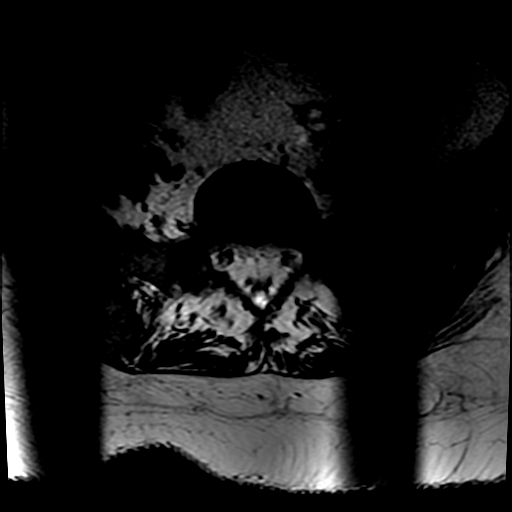
[im 11/34]
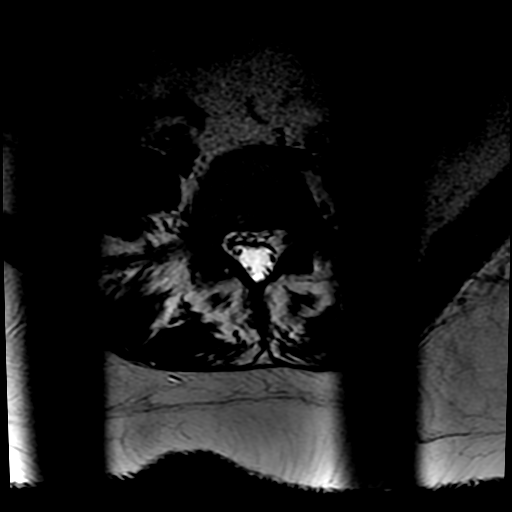
[im 16/34]
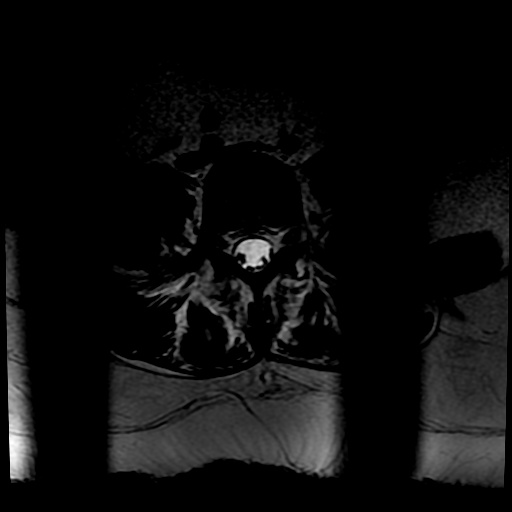
[im 18/34]
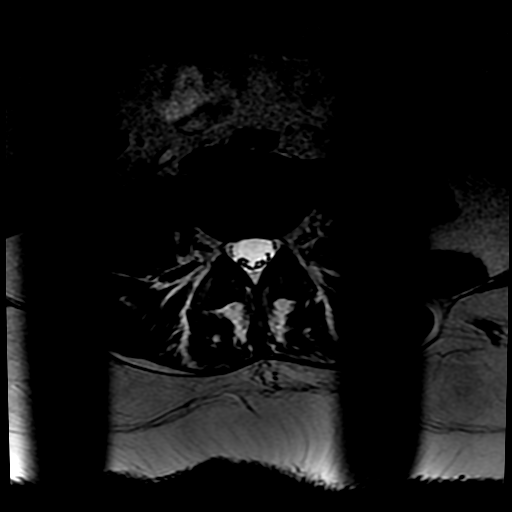
[im 28/34]
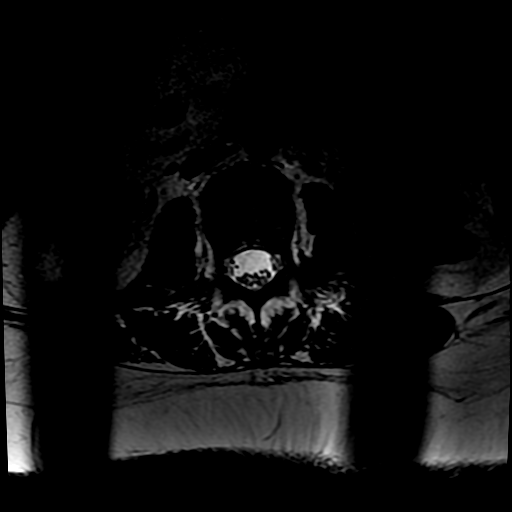

[Series 8: T1 · axial · 4.0mm · 0.39mm/px · z∈[-41,+94]mm · 3 of 34 slices shown (2 of 2)]
[im 6/34]
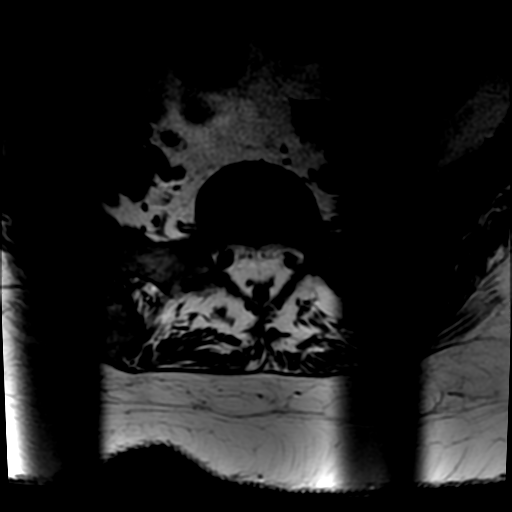
[im 18/34]
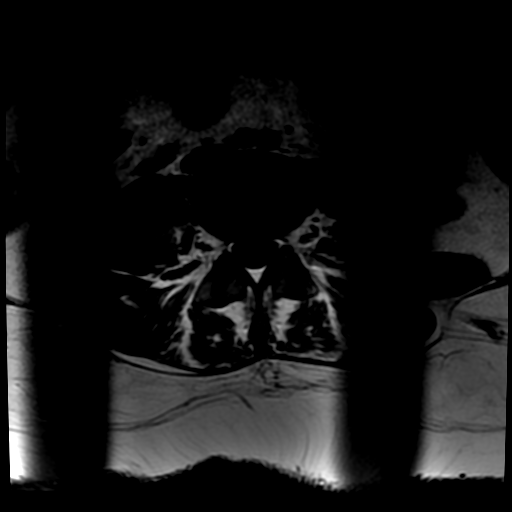
[im 28/34]
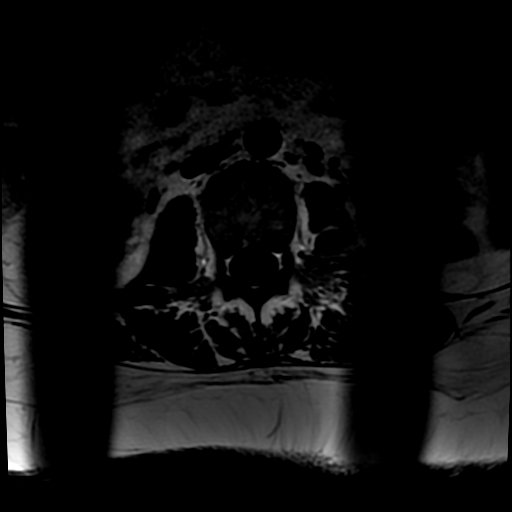

[17 of 48 positions shown; findings below may reference images not displayed]

FINDINGS: MRI CERVICAL SPINE FINDINGS

Alignment: Straightening of the normal cervical lordosis. No
listhesis or malalignment.

Vertebrae: Vertebral body heights are well maintained without
evidence for acute or chronic fracture. Bone marrow signal intensity
within normal limits. No discrete or worrisome osseous lesions. No
abnormal marrow edema.

Cord: Signal intensity within the cervical spinal cord is normal.
Normal cord caliber and morphology.

Posterior Fossa, vertebral arteries, paraspinal tissues: Visualized
brain and posterior fossa within normal limits. Craniocervical
junction normal. Paraspinous and prevertebral soft tissues normal.
Normal intravascular flow voids present within the vertebral
arteries bilaterally.

Disc levels:

C2-C3: Unremarkable.

C3-C4:  Unremarkable.

C4-C5:  Unremarkable.

C5-C6: Small central disc protrusion indents the ventral thecal sac
(series 5, image 13). Resultant mild spinal stenosis without cord
deformity or impingement. Foramina remain widely patent.

C6-C7:  Unremarkable.

C7-T1:  Unremarkable.

Visualized upper thoracic spine demonstrates no significant finding.

MRI LUMBAR SPINE FINDINGS

Segmentation: Normal segmentation. Lowest well-formed disc labeled
the L5-S1 level.

Alignment: Vertebral bodies otherwise normally aligned with
preservation of the normal lumbar lordosis. No listhesis.

Vertebrae: Vertebral body heights maintained without evidence for
acute or chronic fracture. Scattered chronic endplate Schmorl's
nodes noted throughout the lumbar spine. Bone marrow signal
intensity within normal limits. No discrete or worrisome osseous
lesions. No abnormal marrow edema.

Conus medullaris and cauda equina: Conus extends to the L1 level.
Conus and cauda equina appear normal.

Paraspinal and other soft tissues: Left kidney markedly atrophic and
irregular in appearance right kidney demonstrates a lobulated
contour. Prominent 1 cm aortocaval lymph node measures at the upper
limits of normal. Paraspinous soft tissues demonstrate no acute
finding.

Disc levels:

No significant disc pathology seen within the lumbar spine. No disc
bulge or disc protrusion. No significant facet degeneration. Mild
epidural lipomatosis noted within the lower lumbar spine at the
L5-S1 level. No significant canal or neural foraminal stenosis. No
impingement.
IMPRESSION: MRI CERVICAL SPINE IMPRESSION:

1. Small central disc protrusion at C5-6 with resultant mild spinal
stenosis without cord impingement.
2. Otherwise unremarkable and normal MRI of the cervical spine.
Normal MRI appearance of the cervical spinal cord.

MRI LUMBAR SPINE IMPRESSION:

1. Mild epidural lipomatosis within the lower lumbar spine.
2. Chronic left renal atrophy.
3. Otherwise unremarkable MRI of the lumbar spine. No significant
disc or facet pathology. No stenosis or neural impingement.

## 2020-09-17 ENCOUNTER — Encounter (INDEPENDENT_AMBULATORY_CARE_PROVIDER_SITE_OTHER): Payer: Self-pay | Admitting: Gastroenterology

## 2020-09-17 ENCOUNTER — Other Ambulatory Visit: Payer: Self-pay

## 2020-09-17 ENCOUNTER — Ambulatory Visit (INDEPENDENT_AMBULATORY_CARE_PROVIDER_SITE_OTHER): Payer: Medicaid Other | Admitting: Gastroenterology

## 2020-09-17 VITALS — BP 114/79 | HR 101 | Temp 98.3°F | Ht 64.0 in | Wt 240.0 lb

## 2020-09-17 DIAGNOSIS — K863 Pseudocyst of pancreas: Secondary | ICD-10-CM

## 2020-09-17 DIAGNOSIS — K859 Acute pancreatitis without necrosis or infection, unspecified: Secondary | ICD-10-CM | POA: Diagnosis not present

## 2020-09-17 MED ORDER — DICYCLOMINE HCL 10 MG PO CAPS: 10.0000 mg | ORAL_CAPSULE | Freq: Two times a day (BID) | ORAL | 1 refills | Status: DC | PRN

## 2020-09-17 NOTE — Progress Notes (Unsigned)
Sharon Duncan, M.D. Gastroenterology & Hepatology [facility] [address] [place], [place] [address] Primary Care Physician: Suzan Slick, MD [address] [place] [address]  Referring MD: PCP  Chief Complaint: Pancreatic pseudocyst  History of Present Illness: Sharon Duncan is a 30 y.o. female with pelvic with history of depression, diabetes, hypertension, obesity, acute pancreatitis x3 complicated by pseudocyst, CKD who presents for evaluation of pancreatic pseudocyst and recurrent episodes of abdominal pain.  The patient presented her first episode of acute pancreatitis during the middle of 2019.  Her first episode was diagnosed at [facility].  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  Patient was referred for cholecystectomy as outpatient, which she underwent underwent successfully.  However, the patient reported that she developed recurrent episodes of pancreatitis in 2021 which was managed conservatively.  Most recently, the patient Was admitted to [facility] after presenting a new episode of acute pancreatitis on August 05, 2020.  At that time she underwent CT of the abdomen and pelvis without IV contrast on August 06, 2020 which showed changes consistent with acute pancreatitis but there was redemonstration of fluid collection suggestive of a pseudocyst with similar size in proximity to the pancreatic body with extension into the wall of the lesser tributary of the stomach.  There was no new organized collection, with a size of 5.7 x 4.6 cm in size which was larger compared to a previous CT of the abdomen performed on August 2019.  The patient had resolution of her episode of acute pancreatitis and underwent a repeat CT of the abdomen and pelvis with IV contrast at [facility] on  September 10, 2020 which showed a 6.4 x 5.3 cm pancreatic pseudocyst without presence of septations.  The patient states that she has presented recurrent episodes of upper abdominal discomfort and nausea after her episode of pancreatitis.  She has been able to tolerate some of the food but states that is still feeling weak.  States having intermittent episodes abdominal distention.  Denies having any fever, chills, hematochezia, melena, hematemesis, abdominal pain, diarrhea, jaundice, pruritus.  The patient was referred to interventional radiology by her PCP, but the interventional radiologist stated that the pseudocyst could be better managed endoscopically done with imaging guided drainage.  Last EGD: Never Last Colonoscopy: Never  FHx: neg for any gastrointestinal/liver disease, no malignancies, no family history of pancreatitis Social: neg smoking, alcohol or illicit drug use Surgical: no abdominal surgeries  Past Medical History: Past Medical History:  Diagnosis Date  . CKD (chronic kidney disease), stage III (HCC) 03/10/2018  . Depression   . Diabetes mellitus type 2, controlled (HCC)   . Frequent UTI   . Hypertension   . Obesity hypoventilation syndrome (HCC) 03/10/2018  . Pancreatic pseudocyst   . Pancreatitis due to common bile duct stone   . Paresthesia   . Vitamin D deficiency     Past Surgical History: Past Surgical History:  Procedure Laterality Date  . CHOLECYSTECTOMY    . KIDNEY SURGERY  2004   ?to lift kidney? d/t frequent UTIs  . TOOTH EXTRACTION N/A 03/03/2014   Procedure: EXTRACTION OF WISDOM TEETH;  Surgeon: Georgia Lopes, DDS;  Location[facility] OR;  Service: Oral Surgery;  Laterality: N/A;    Family History: Family History  Problem Relation Age of Onset  .  Lung disease Mother   . Other Father        died in car accident when patient was 30 years old    Social History: Social History   Tobacco Use  Smoking Status Never Smoker  Smokeless Tobacco Never  Used   Social History   Substance and Sexual Activity  Alcohol Use Not Currently   Social History   Substance and Sexual Activity  Drug Use No    Allergies: Allergies  Allergen Reactions  . Rocephin [Ceftriaxone] Rash    Medications: Current Outpatient Medications  Medication Sig Dispense Refill  . glipiZIDE (GLUCOTROL) 5 MG tablet Take 5 mg by mouth 3 (three) times daily before meals.    . insulin glargine (LANTUS) 100 UNIT/ML injection Inject 40 Units into the skin daily.     No current facility-administered medications for this visit.    Review of Systems: GENERAL: negative for malaise, night sweats HEENT: No changes in hearing or vision, no nose bleeds or other nasal problems. NECK: Negative for lumps, goiter, pain and significant neck swelling RESPIRATORY: Negative for cough, wheezing CARDIOVASCULAR: Negative for chest pain, leg swelling, palpitations, orthopnea GI: SEE HPI MUSCULOSKELETAL: Negative for joint pain or swelling, back pain, and muscle pain. SKIN: Negative for lesions, rash PSYCH: Negative for sleep disturbance, mood disorder and recent psychosocial stressors. HEMATOLOGY Negative for prolonged bleeding, bruising easily, and swollen nodes. ENDOCRINE: Negative for cold or heat intolerance, polyuria, polydipsia and goiter. NEURO: negative for tremor, gait imbalance, syncope and seizures. The remainder of the review of systems is noncontributory.   Physical Exam: BP 114/79 (BP Location: Left Arm, Patient Position: Sitting, Cuff Size: Small)   Pulse (!) 101   Temp 98.3 F (36.8 C) (Oral)   Ht 5\' 4"  (1.626 m)   Wt 240 lb (108.9 kg)   BMI 41.20 kg/m  GENERAL: The patient is AO x3, in no acute distress. HEENT: Head is normocephalic and atraumatic. EOMI are intact. Mouth is well hydrated and without lesions. NECK: Supple. No masses LUNGS: Clear to auscultation. No presence of rhonchi/wheezing/rales. Adequate chest expansion HEART: RRR, normal s1 and  s2. ABDOMEN: Mildly tender upon palpation of the upper abdomen, no guarding, no peritoneal signs, and nondistended. BS +. No masses. EXTREMITIES: Without any cyanosis, clubbing, rash, lesions or edema. NEUROLOGIC: AOx3, no focal motor deficit. SKIN: no jaundice, no rashes   Imaging/Labs: as above  I personally reviewed and interpreted the available labs, imaging and endoscopic files.  Impression and Plan: [place]ha Behlke is a 30 y.o. female with pelvic with history of depression, diabetes, hypertension, obesity, acute pancreatitis x3 complicated by pseudocyst, CKD who presents for evaluation of pancreatic pseudocyst and recurrent episodes of abdominal pain.  The patient has presented recurrent episodes of acute pancreatitis of unclear etiology, which was initially attributed to gallstones.  However she had recurrent episodes x2 after her gallbladder was removed.  As a complication of this she developed a pseudocyst that has slightly increased in size for the last 3 years and a half.  This may explain part of the abdominal discomfort she is currently presenting.  I explained to the patient that an option for management of her pancreatic pseudocyst will be a cyst gastrostomy with consideration of an endoscopic ultrasound for evaluation of microlithiasis leading to episodes of recurrent pancreatitis.  However, I explained to her that none of the gastroenterologist at [facility] perform this procedure and we will need to refer her to [facility] for our advanced endoscopist to evaluate her.  I  explained to her that other rare causes of pancreatitis may need to be explored in the future, with IgG4 testing or genetic testing, although I consider this to be checked once an EUS has been performed.  The patient understood and agreed.  I will prescribe some dicyclomine to improve her abdominal discomfort.  - Referral to advanced endoscopist (Dr. Meridee Score or Christella Hartigan) for EUS cyst needle aspiration vs cyst  gastrostomy and EUS of CBD. - Dicyclomine as needed for abdominal pain  All questions were answered.      Sharon Blazing, MD Gastroenterology and Hepatology [facility]

## 2020-09-17 NOTE — Patient Instructions (Signed)
Referral to [facility] advanced gastroenterologist for possible EUS with cyst drainage/Axios stent placement

## 2020-09-23 ENCOUNTER — Telehealth: Payer: Self-pay

## 2020-09-23 NOTE — Telephone Encounter (Signed)
----- Message from Lemar Lofty., MD sent at 09/23/2020  1:26 PM EST ----- Regarding: RE: EUS Christmas Faraci, Dr. Marguerita Merles and I have discussed the case.  I have reviewed the clinic note.  Please schedule for a clinic visit (okay to use my normally held 930/1050/230 slots).  We will then proceed with scheduling EUS and possible cyst gastrostomy versus aspiration depending on our discussion.  Please update [place] to when the patient's clinic visit is scheduled.  Thanks. GM ----- Message ----- From: Dolores Frame, MD Sent: 09/22/2020   6:46 AM EST To: Lemar Lofty., MD Subject: RE: EUS                                        Thanks! ----- Message ----- From: Lemar Lofty., MD Sent: 09/22/2020   5:48 AM EST To: Rachael Fee, MD, Loretha Stapler, RN, # Subject: RE: EUS                                        DCM, I will try and touch base with you and finalize a plan hopefully later today. Thanks. GM ----- Message ----- From: Dolores Frame, MD Sent: 09/21/2020   5:11 PM EST To: Rachael Fee, MD, Loretha Stapler, RN, # Subject: RE: EUS                                        Harlene Salts, Thanks for your message. Note is in for her. Text or call me any time to discuss her case in detail, I sent you a message earlier today. thanks ----- Message ----- From: Lemar Lofty., MD Sent: 09/18/2020   9:46 AM EST To: Rachael Fee, MD, Loretha Stapler, RN, # Subject: RE: EUS                                        Shuan Statzer, I will review the finalized note when it has been completed and discussed with Dr. Levon Hedger further.  If we do end up considering EUS cystgastrostomy, I will need to see her in clinic to discuss things. Thanks.  GM Shoot me a message or call or text DCM when you are done with her note and happy to talk with you further. ----- Message ----- From: Rachael Fee, MD Sent: 09/18/2020   9:16 AM EST To: Loretha Stapler, RN,  Lemar Lofty., MD Subject: RE: EUS                                        I'll leave this to GM, I don't place that kind of stent.  Not sure it's necessary anyway but office note from yesterday is not completed so it's hard to know.   ----- Message ----- From: Loretha Stapler, RN Sent: 09/18/2020   9:06 AM EST To: Rachael Fee, MD, # Subject: FW: EUS                                         -----  Message ----- From: Simone Curia Sent: 09/18/2020   8:54 AM EST To: Loretha Stapler, RN Subject: EUS                                            Patient needs EUS & possible axios stent for pancreatic pseudocyst

## 2020-09-23 NOTE — Telephone Encounter (Signed)
Appt made with Dr Meridee Score to discuss procedure on 10/08/20 at 310 pm   The patient has been notified of this information and all questions answered.

## 2020-10-08 ENCOUNTER — Encounter: Payer: Self-pay | Admitting: Gastroenterology

## 2020-10-08 ENCOUNTER — Other Ambulatory Visit (INDEPENDENT_AMBULATORY_CARE_PROVIDER_SITE_OTHER): Payer: Medicaid Other

## 2020-10-08 ENCOUNTER — Other Ambulatory Visit: Payer: Self-pay

## 2020-10-08 ENCOUNTER — Ambulatory Visit (INDEPENDENT_AMBULATORY_CARE_PROVIDER_SITE_OTHER): Payer: Medicaid Other | Admitting: Gastroenterology

## 2020-10-08 VITALS — BP 116/72 | HR 84 | Ht 64.0 in | Wt 241.4 lb

## 2020-10-08 DIAGNOSIS — K863 Pseudocyst of pancreas: Secondary | ICD-10-CM

## 2020-10-08 DIAGNOSIS — Z9049 Acquired absence of other specified parts of digestive tract: Secondary | ICD-10-CM

## 2020-10-08 DIAGNOSIS — K859 Acute pancreatitis without necrosis or infection, unspecified: Secondary | ICD-10-CM

## 2020-10-08 DIAGNOSIS — R935 Abnormal findings on diagnostic imaging of other abdominal regions, including retroperitoneum: Secondary | ICD-10-CM | POA: Diagnosis not present

## 2020-10-08 DIAGNOSIS — R6881 Early satiety: Secondary | ICD-10-CM

## 2020-10-08 LAB — CBC
HCT: 36.3 % (ref 36.0–46.0)
Hemoglobin: 12.2 g/dL (ref 12.0–15.0)
MCHC: 33.7 g/dL (ref 30.0–36.0)
MCV: 84.6 fl (ref 78.0–100.0)
Platelets: 286 10*3/uL (ref 150.0–400.0)
RBC: 4.29 Mil/uL (ref 3.87–5.11)
RDW: 15.3 % (ref 11.5–15.5)
WBC: 8.1 10*3/uL (ref 4.0–10.5)

## 2020-10-08 LAB — PROTIME-INR
INR: 1 ratio (ref 0.8–1.0)
Prothrombin Time: 11.4 s (ref 9.6–13.1)

## 2020-10-08 LAB — BASIC METABOLIC PANEL
BUN: 16 mg/dL (ref 6–23)
CO2: 27 mEq/L (ref 19–32)
Calcium: 9.5 mg/dL (ref 8.4–10.5)
Chloride: 103 mEq/L (ref 96–112)
Creatinine, Ser: 1.18 mg/dL (ref 0.40–1.20)
GFR: 62.96 mL/min (ref >60.00)
Glucose, Bld: 281 mg/dL — ABNORMAL HIGH (ref 70–99)
Potassium: 4.4 mEq/L (ref 3.5–5.1)
Sodium: 138 mEq/L (ref 135–145)

## 2020-10-08 NOTE — Progress Notes (Signed)
GASTROENTEROLOGY OUTPATIENT CLINIC VISIT   Primary Care Provider Suzan Slick, MD [address] [place] [address] [phone]  Referring Provider Dr. Levon Hedger  Patient Profile: Sharon Duncan is a 30 y.o. female with a pmh significant for recurrent pancreatitis (initially thought secondary to cholelithiasis but now status post cholecystectomy with recurrent episode), pancreatic cyst (most likely pseudocyst), diabetes, obesity.  The patient presents to the [facility] for an evaluation and management of problem(s) noted below:  Problem List 1. Pancreatic pseudocyst   2. Acute recurrent pancreatitis   3. Early satiety   4. Abnormal CT of the pancreas   5. History of cholecystectomy     History of Present Illness This is the patient's first visit to the outpatient [facility].  She recently established GI care through the [facility] with Dr. Levon Hedger.  Clinical history 2019 of pancreatitis that was felt to be cholelithiasis driven leading to cholecystectomy.  She was found during that hospital stay to have concern for a pancreatic pseudocyst.  During that hospitalization she was followed by the [facility] GI team.  After her cholecystectomy she did okay.  However, in 2021 however she has had recurrent episodes of abdominal discomfort that she was able to manage at home.  However in 2022 she had a significant episode that led to hospitalization at [facility].  Imaging was performed there that showed a pancreatic pseudocyst.  This was followed up within a month and found to be larger.  She ended up seeing Dr. 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  Today, the patient is accompanied by her husband who helps with the back story and history.  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  She denies  any significant alcohol consumption.  She denies any tobacco use.  No family history of pancreas issues that she is aware of.  Patient does not take significant nonsteroidals or BC/Goody powders.  She experiences significant early satiety.  However weight loss has overall stabilized and not been a progressive issue since January.  The patient has never had an upper or lower endoscopy.  GI Review of Systems Positive as above including very infrequent GERD (diet controlled) Negative for dysphagia, odynophagia, nausea, vomiting, change in bowel habits, melena, hematochezia  Review of Systems General: Denies fevers/chills HEENT: Denies oral lesions Cardiovascular: Denies chest pain/palpitations Pulmonary: Denies shortness of breath Gastroenterological: See HPI Genitourinary: Denies darkened urine or hematuria Hematological: Denies easy bruising/bleeding Dermatological: Denies jaundice Psychological: Mood is anxious   Medications Current Outpatient Medications  Medication Sig Dispense Refill  . dicyclomine (BENTYL) 10 MG capsule Take 1 capsule (10 mg total) by mouth every 12 (twelve) hours as needed (abdominal pain). 60 capsule 1  . glipiZIDE (GLUCOTROL) 5 MG tablet Take 5 mg by mouth 3 (three) times daily before meals.    . insulin glargine (LANTUS) 100 UNIT/ML injection Inject 40 Units into the skin daily.     No current facility-administered medications for this visit.    Allergies Allergies  Allergen Reactions  . Rocephin [Ceftriaxone] Rash    Histories Past Medical History:  Diagnosis Date  . CKD (chronic kidney disease), stage III (HCC) 03/10/2018  . Depression   . Diabetes mellitus type 2, controlled (HCC)   . Frequent UTI   . Hypertension   . Obesity hypoventilation syndrome (HCC) 03/10/2018  . Pancreatic pseudocyst   .  Pancreatitis due to common bile duct stone   . Paresthesia   . Vitamin D deficiency    Past Surgical History:  Procedure Laterality Date  .  CHOLECYSTECTOMY    . KIDNEY SURGERY  2004   ?to lift kidney? d/t frequent UTIs  . TOOTH EXTRACTION N/A 03/03/2014   Procedure: EXTRACTION OF WISDOM TEETH;  Surgeon: Georgia LopesScott M Jensen, DDS;  [facility];  Service: Oral Surgery;  Laterality: N/A;   Social History   Socioeconomic History  . Marital status: Single    Spouse name: Not on file  . Number of children: 4  . Years of education: some college  . Highest education level: Some college, no degree  Occupational History  . Occupation: stay at home mom  Tobacco Use  . Smoking status: Never Smoker  . Smokeless tobacco: Never Used  Vaping Use  . Vaping Use: Never used  Substance and Sexual Activity  . Alcohol use: Not Currently  . Drug use: No  . Sexual activity: Yes    Birth control/protection: Condom  Other Topics Concern  . Not on file  Social History Narrative   Lives at home with husband and four children.   Right-handed.   No daily caffeine use.   Social Determinants of Health   Financial Resource Strain: Not on file  Food Insecurity: Not on file  Transportation Needs: Not on file  Physical Activity: Not on file  Stress: Not on file  Social Connections: Not on file  Intimate Partner Violence: Not on file   Family History  Problem Relation Age of Onset  . Lung disease Mother   . Other Father        died in car accident when patient was 30 years old  . Esophageal cancer Neg Hx   . Pancreatic cancer Neg Hx   . Colon cancer Neg Hx   . Stomach cancer Neg Hx   . Inflammatory bowel disease Neg Hx   . Liver disease Neg Hx   . Rectal cancer Neg Hx    I have reviewed her medical, social, and family history in detail and updated the electronic medical record as necessary.    PHYSICAL EXAMINATION  BP 116/72   Pulse 84   Ht 5\' 4"  (1.626 m)   Wt 241 lb 6.4 oz (109.5 kg)   BMI 41.44 kg/m  Wt Readings from Last 3 Encounters:  10/08/20 241 lb 6.4 oz (109.5 kg)  09/17/20 240 lb (108.9 kg)  08/22/18 231 lb (104.8  kg)  GEN: NAD, appears stated age, doesn't appear chronically ill, accompanied by husband PSYCH: Cooperative, without pressured speech EYE: Conjunctivae pink, sclerae anicteric ENT: Masked CV: RR without R/Gs  RESP: CTAB posteriorly, without wheezing GI: NABS, soft, obese, rounded, mild tenderness to deep palpation in midepigastrium, no rebound  MSK/EXT: Trace bilateral lower extremity edema SKIN: No jaundice NEURO:  Alert & Oriented x 3, no focal deficits   REVIEW OF DATA  I reviewed the following data at the time of this encounter:  GI Procedures and Studies  No relevant studies to review  Laboratory Studies  Reviewed those in epic  Imaging Studies  September 10, 2020 CT abdomen pelvis IMPRESSION: Changes of acute pancreatitis seen on the comparison CT scan have resolved. No acute finding on today's study. 6.4 x 5.3 cm pancreatic pseudocyst along the lesser curvature of stomach is slightly increased since the prior exam. No new pseudocyst. Mild fatty infiltration of the liver. Left much worse than right renal  scarring with a configuration suggestive of chronic vesicoureteral reflux.  July 2019 CT abdomen pelvis IMPRESSION: 1. Interval development of bilateral pleural effusions and bibasilar atelectasis. 2. Development of phlegmon versus early pseudocyst along the dorsum of the pancreas. 3. New phlegmon or early ischemic in the mid pancreatic body, measuring 2.2 centimeters. 4. New RIGHT-sided hydronephrosis likely secondary to pancreatitis. Distal urinary tract stones are not entirely excluded but thought to be less likely. 5. Persistent significant atrophy of the LEFT kidney. 6. New thickening of the stomach wall, felt to be secondary to pancreatitis. 7. Mild secondary inflammation of the UPPER small bowel loops and hepatic and transverse segments of the colon. 8. Cholelithiasis.   ASSESSMENT  Ms. Bastarache is a 30 y.o. female with a pmh significant for recurrent  pancreatitis (initially thought secondary to cholelithiasis but now status post cholecystectomy with recurrent episode), pancreatic cyst (most likely pseudocyst), diabetes, obesity.  The patient is seen today for evaluation and management of:  1. Pancreatic pseudocyst   2. Acute recurrent pancreatitis   3. Early satiety   4. Abnormal CT of the pancreas   5. History of cholecystectomy    The patient is hemodynamically stable.  Clinically, she seems to be stabilizing as well.  The patient is experiencing significant early satiety but weight loss has stabilized and not been an issue for the last few weeks.  With the patient's abdominal discomfort that persists and her early satiety it is reasonable for [place] to consider further work-up and management of this pancreatic cyst.  I suspect this is a chronic pseudocyst entheses we never had any imaging from 2019 up to 2022) versus the possibility of a recurrent pseudocyst in setting of recent pancreatitis in January.  With this being said, the size of this may allow [place] to consider cyst gastrostomy creation but is not clear to me that it would really be causing a significant mount of symptoms that she is having.  Small risk of this being a MCN or IPMN but further evaluation via diagnostic EGD/EUS is reasonable.  Consideration of aspiration at that time will also be had.  We also discussed the consideration of just monitoring the cyst closely.  Genetic testing may be considered for the patient in the future as it pertains to her children should she have some sort of underlying genetic abnormality or mutation.  I will defer that for now and we can consider that after EUS.  The risks of an EUS, including intestinal perforation, bleeding, infection, aspiration, and medication effects were discussed.  When a cystgastrostomy/cystenterostomy is performed as part of the EUS, there is an additional risk of pancreatitis at the rate of about 1-2%.  It was explained that procedure  related pancreatitis is typically mild, although at times it can be severe and even life threatening.  The risks and benefits of endoscopic evaluation were discussed with the patient; these include but are not limited to the risk of perforation, infection, bleeding, missed lesions, lack of diagnosis, severe illness requiring hospitalization, as well as anesthesia and sedation related illnesses.  The patient is agreeable to proceed.  All patient questions were answered to the best of my ability, and the patient agrees to the aforementioned plan of action with follow-up as indicated.   PLAN  Preprocedure labs to be obtained as outlined below Proceed with scheduling EGD/EUS with possible aspiration versus gastrostomy We will consider genetics testing (Spink/CF TR/PR SS-1) in future   Orders Placed This Encounter  Procedures  . Procedural/ Surgical  Case Request: UPPER ESOPHAGEAL ENDOSCOPIC ULTRASOUND (EUS)  . CBC  . Basic Metabolic Panel (BMET)  . INR/PT  . Ambulatory referral to Gastroenterology    New Prescriptions   No medications on file   Modified Medications   No medications on file    Planned Follow Up No follow-ups on file.   Total Time in Face-to-Face and in Coordination of Care for patient including independent/personal interpretation/review of prior testing, review of the imaging in depth via computer with the patient/husband, medical history, examination, medication adjustment, communicating results with the patient directly, and documentation with the EHR is 45 minutes.   Corliss Parish, MD [facility] Advanced Endoscopy Office # [phone]

## 2020-10-08 NOTE — Patient Instructions (Signed)
If you are age 29 or older, your body mass index should be between 23-30. Your Body mass index is 41.44 kg/m. If this is out of the aforementioned range listed, please consider follow up with your Primary Care Provider.  If you are age 64 or younger, your body mass index should be between 19-25. Your Body mass index is 41.44 kg/m. If this is out of the aformentioned range listed, please consider follow up with your Primary Care Provider.    You have been scheduled for an endoscopy. Please follow written instructions given to you at your visit today. If you use inhalers (even only as needed), please bring them with you on the day of your procedure.  Your provider has requested that you go to the basement level for lab work before leaving today. Press "B" on the elevator. The lab is located at the first door on the left as you exit the elevator.  Due to recent changes in healthcare laws, you may see the results of your imaging and laboratory studies on MyChart before your provider has had a chance to review them.  We understand that in some cases there may be results that are confusing or concerning to you. Not all laboratory results come back in the same time frame and the provider may be waiting for multiple results in order to interpret others.  Please give [place] 48 hours in order for your provider to thoroughly review all the results before contacting the office for clarification of your results.   Thank you for choosing me and [facility].  Dr. Meridee Score

## 2020-10-15 ENCOUNTER — Encounter: Payer: Self-pay | Admitting: Gastroenterology

## 2020-10-15 DIAGNOSIS — Z9049 Acquired absence of other specified parts of digestive tract: Secondary | ICD-10-CM | POA: Insufficient documentation

## 2020-10-15 DIAGNOSIS — R6881 Early satiety: Secondary | ICD-10-CM | POA: Insufficient documentation

## 2020-10-15 DIAGNOSIS — R935 Abnormal findings on diagnostic imaging of other abdominal regions, including retroperitoneum: Secondary | ICD-10-CM | POA: Insufficient documentation

## 2020-11-02 ENCOUNTER — Other Ambulatory Visit: Payer: Self-pay

## 2020-11-05 ENCOUNTER — Other Ambulatory Visit (HOSPITAL_COMMUNITY): Payer: Medicaid Other

## 2020-11-06 NOTE — Anesthesia Preprocedure Evaluation (Addendum)
Anesthesia Evaluation  Patient identified by MRN, date of birth, ID band Patient awake    Reviewed: Allergy & Precautions, H&P , NPO status , Patient's Chart, lab work & pertinent test results  Airway Mallampati: III  TM Distance: >3 FB Neck ROM: Full    Dental no notable dental hx. (+) Teeth Intact, Dental Advisory Given   Pulmonary neg pulmonary ROS,    Pulmonary exam normal breath sounds clear to auscultation       Cardiovascular Exercise Tolerance: Good hypertension,  Rhythm:Regular Rate:Normal     Neuro/Psych Depression negative neurological ROS     GI/Hepatic negative GI ROS, Neg liver ROS,   Endo/Other  diabetes, Insulin Dependent, Oral Hypoglycemic AgentsMorbid obesity  Renal/GU negative Renal ROS  negative genitourinary   Musculoskeletal   Abdominal   Peds  Hematology negative hematology ROS (+)   Anesthesia Other Findings   Reproductive/Obstetrics negative OB ROS                            Anesthesia Physical Anesthesia Plan  ASA: III  Anesthesia Plan: General   Post-op Pain Management:    Induction: Intravenous  PONV Risk Score and Plan: 3 and Propofol infusion, Treatment may vary due to age or medical condition, Ondansetron and Midazolam  Airway Management Planned: Oral ETT  Additional Equipment:   Intra-op Plan:   Post-operative Plan: Extubation in OR  Informed Consent: I have reviewed the patients History and Physical, chart, labs and discussed the procedure including the risks, benefits and alternatives for the proposed anesthesia with the patient or authorized representative who has indicated his/her understanding and acceptance.     Dental advisory given  Plan Discussed with: CRNA and Surgeon  Anesthesia Plan Comments:        Anesthesia Quick Evaluation

## 2020-11-09 ENCOUNTER — Ambulatory Visit (HOSPITAL_COMMUNITY)
Admission: RE | Admit: 2020-11-09 | Discharge: 2020-11-09 | Disposition: A | Discharge status: Home / Self Care | Payer: Medicaid Other | Attending: Gastroenterology | Admitting: Gastroenterology

## 2020-11-09 ENCOUNTER — Other Ambulatory Visit: Payer: Self-pay

## 2020-11-09 ENCOUNTER — Encounter (HOSPITAL_COMMUNITY)
Admission: RE | Disposition: A | Discharge status: Home / Self Care | Payer: Self-pay | Source: Home / Self Care | Attending: Gastroenterology

## 2020-11-09 ENCOUNTER — Ambulatory Visit (HOSPITAL_COMMUNITY): Payer: Medicaid Other | Admitting: Anesthesiology

## 2020-11-09 DIAGNOSIS — K3189 Other diseases of stomach and duodenum: Secondary | ICD-10-CM | POA: Diagnosis not present

## 2020-11-09 DIAGNOSIS — K2980 Duodenitis without bleeding: Secondary | ICD-10-CM | POA: Insufficient documentation

## 2020-11-09 DIAGNOSIS — Z836 Family history of other diseases of the respiratory system: Secondary | ICD-10-CM | POA: Diagnosis not present

## 2020-11-09 DIAGNOSIS — E1122 Type 2 diabetes mellitus with diabetic chronic kidney disease: Secondary | ICD-10-CM | POA: Diagnosis not present

## 2020-11-09 DIAGNOSIS — K449 Diaphragmatic hernia without obstruction or gangrene: Secondary | ICD-10-CM | POA: Diagnosis not present

## 2020-11-09 DIAGNOSIS — K31A0 Gastric intestinal metaplasia, unspecified: Secondary | ICD-10-CM | POA: Diagnosis not present

## 2020-11-09 DIAGNOSIS — K2970 Gastritis, unspecified, without bleeding: Secondary | ICD-10-CM

## 2020-11-09 DIAGNOSIS — I129 Hypertensive chronic kidney disease with stage 1 through stage 4 chronic kidney disease, or unspecified chronic kidney disease: Secondary | ICD-10-CM | POA: Diagnosis not present

## 2020-11-09 DIAGNOSIS — K863 Pseudocyst of pancreas: Secondary | ICD-10-CM

## 2020-11-09 DIAGNOSIS — Z881 Allergy status to other antibiotic agents status: Secondary | ICD-10-CM | POA: Insufficient documentation

## 2020-11-09 DIAGNOSIS — N183 Chronic kidney disease, stage 3 unspecified: Secondary | ICD-10-CM | POA: Diagnosis not present

## 2020-11-09 DIAGNOSIS — K862 Cyst of pancreas: Secondary | ICD-10-CM | POA: Diagnosis present

## 2020-11-09 DIAGNOSIS — K259 Gastric ulcer, unspecified as acute or chronic, without hemorrhage or perforation: Secondary | ICD-10-CM | POA: Insufficient documentation

## 2020-11-09 DIAGNOSIS — K859 Acute pancreatitis without necrosis or infection, unspecified: Secondary | ICD-10-CM | POA: Diagnosis not present

## 2020-11-09 HISTORY — PX: PANCREATIC STENT PLACEMENT: SHX5539

## 2020-11-09 HISTORY — PX: ESOPHAGOGASTRODUODENOSCOPY (EGD) WITH PROPOFOL: SHX5813

## 2020-11-09 HISTORY — PX: BIOPSY: SHX5522

## 2020-11-09 HISTORY — PX: UPPER ESOPHAGEAL ENDOSCOPIC ULTRASOUND (EUS): SHX6562

## 2020-11-09 HISTORY — PX: CYST GASTROSTOMY: SHX6862

## 2020-11-09 HISTORY — PX: BALLOON DILATION: SHX5330

## 2020-11-09 LAB — GLUCOSE, CAPILLARY
Glucose-Capillary: 329 mg/dL — ABNORMAL HIGH (ref 70–99)
Glucose-Capillary: 324 mg/dL — ABNORMAL HIGH (ref 70–99)
Glucose-Capillary: 304 mg/dL — ABNORMAL HIGH (ref 70–99)

## 2020-11-09 SURGERY — UPPER ESOPHAGEAL ENDOSCOPIC ULTRASOUND (EUS)
Anesthesia: General

## 2020-11-09 MED ORDER — PHENYLEPHRINE 40 MCG/ML (10ML) SYRINGE FOR IV PUSH (FOR BLOOD PRESSURE SUPPORT)
PREFILLED_SYRINGE | INTRAVENOUS | Status: DC | PRN
  Administered 2020-11-09 (×3): 80 ug via INTRAVENOUS
  Administered 2020-11-09: 40 ug via INTRAVENOUS
  Administered 2020-11-09: 120 ug via INTRAVENOUS

## 2020-11-09 MED ORDER — PROPOFOL 500 MG/50ML IV EMUL
INTRAVENOUS | Status: AC
  Filled 2020-11-09: qty 50

## 2020-11-09 MED ORDER — FENTANYL CITRATE (PF) 250 MCG/5ML IJ SOLN
INTRAMUSCULAR | Status: DC | PRN
  Administered 2020-11-09: 100 ug via INTRAVENOUS

## 2020-11-09 MED ORDER — ROCURONIUM BROMIDE 10 MG/ML (PF) SYRINGE
PREFILLED_SYRINGE | INTRAVENOUS | Status: DC | PRN
  Administered 2020-11-09: 50 mg via INTRAVENOUS
  Administered 2020-11-09: 20 mg via INTRAVENOUS

## 2020-11-09 MED ORDER — PROPOFOL 1000 MG/100ML IV EMUL
INTRAVENOUS | Status: AC
  Filled 2020-11-09: qty 100

## 2020-11-09 MED ORDER — PROPOFOL 10 MG/ML IV BOLUS
INTRAVENOUS | Status: DC | PRN
  Administered 2020-11-09: 200 mg via INTRAVENOUS

## 2020-11-09 MED ORDER — CIPROFLOXACIN IN D5W 400 MG/200ML IV SOLN
INTRAVENOUS | Status: AC
  Filled 2020-11-09: qty 200

## 2020-11-09 MED ORDER — INSULIN ASPART 100 UNIT/ML ~~LOC~~ SOLN
10.0000 [IU] | Freq: Once | SUBCUTANEOUS | Status: AC
  Administered 2020-11-09: 10 [IU] via SUBCUTANEOUS

## 2020-11-09 MED ORDER — LACTATED RINGERS IV SOLN: INTRAVENOUS | Status: DC

## 2020-11-09 MED ORDER — CIPROFLOXACIN IN D5W 400 MG/200ML IV SOLN
INTRAVENOUS | Status: DC | PRN
  Administered 2020-11-09: 400 mg via INTRAVENOUS

## 2020-11-09 MED ORDER — MIDAZOLAM HCL 2 MG/2ML IJ SOLN
INTRAMUSCULAR | Status: AC
  Filled 2020-11-09: qty 2

## 2020-11-09 MED ORDER — SODIUM CHLORIDE 0.9 % IV SOLN: INTRAVENOUS | Status: DC

## 2020-11-09 MED ORDER — MIDAZOLAM HCL 5 MG/5ML IJ SOLN
INTRAMUSCULAR | Status: DC | PRN
  Administered 2020-11-09: 2 mg via INTRAVENOUS

## 2020-11-09 MED ORDER — LIDOCAINE 2% (20 MG/ML) 5 ML SYRINGE
INTRAMUSCULAR | Status: DC | PRN
  Administered 2020-11-09: 60 mg via INTRAVENOUS

## 2020-11-09 MED ORDER — SUGAMMADEX SODIUM 200 MG/2ML IV SOLN
INTRAVENOUS | Status: DC | PRN
  Administered 2020-11-09: 200 mg via INTRAVENOUS

## 2020-11-09 MED ORDER — CIPROFLOXACIN HCL 500 MG PO TABS: 500.0000 mg | ORAL_TABLET | Freq: Two times a day (BID) | ORAL | 0 refills | Status: DC

## 2020-11-09 MED ORDER — INSULIN ASPART 100 UNIT/ML ~~LOC~~ SOLN
SUBCUTANEOUS | Status: AC
  Filled 2020-11-09: qty 1

## 2020-11-09 MED ORDER — PHENYLEPHRINE HCL-NACL 10-0.9 MG/250ML-% IV SOLN
INTRAVENOUS | Status: DC | PRN
  Administered 2020-11-09: 50 ug/min via INTRAVENOUS

## 2020-11-09 MED ORDER — ONDANSETRON HCL 4 MG/2ML IJ SOLN
INTRAMUSCULAR | Status: DC | PRN
  Administered 2020-11-09 (×2): 4 mg via INTRAVENOUS

## 2020-11-09 MED ORDER — FENTANYL CITRATE (PF) 100 MCG/2ML IJ SOLN
INTRAMUSCULAR | Status: AC
  Filled 2020-11-09: qty 2

## 2020-11-09 NOTE — Addendum Note (Signed)
Addendum  created 11/09/20 1204 by Gaynelle Adu, MD   SmartForm saved

## 2020-11-09 NOTE — Op Note (Signed)
[facility] Patient Name: Sharon Duncan Procedure Date: 11/09/2020 MRN: [id number] Attending MD: Justice Britain , MD Date of Birth: 06-15-1991 CSN: [id number] Age: 30 Admit Type: Outpatient Procedure:                Upper EUS Indications:              Pancreatic cyst on CT scan, Acute recurrent                            pancreatitis, For cyst enterostomy, Early satiety,                            Nausea Providers:                Justice Britain, MD, Jeanella Cara, RN,                            Cletis Athens, Technician, Caryl Pina CRNA Referring MD:             Maylon Peppers, Catalina Antigua, MD Medicines:                General Anesthesia, Cipro 782 mg IV Complications:            No immediate complications. Estimated Blood Loss:     Estimated blood loss was minimal. Procedure:                Pre-Anesthesia Assessment:                           - Prior to the procedure, a History and Physical                            was performed, and patient medications and                            allergies were reviewed. The patient's tolerance of                            previous anesthesia was also reviewed. The risks                            and benefits of the procedure and the sedation                            options and risks were discussed with the patient.                            All questions were answered, and informed consent                            was obtained. Prior Anticoagulants: The patient has                            taken no previous anticoagulant or antiplatelet  agents. ASA Grade Assessment: II - A patient with                            mild systemic disease. After reviewing the risks                            and benefits, the patient was deemed in                            satisfactory condition to undergo the procedure.                           After obtaining informed consent, the  endoscope was                            passed under direct vision. Throughout the                            procedure, the patient's blood pressure, pulse, and                            oxygen saturations were monitored continuously. The                            [id number]-H190 ([id number]) Olympus gastroscope was                            introduced through the mouth, and advanced to the                            second part of duodenum. The Olympus [id number]                            [id number]) was introduced through the mouth, and                            advanced to the second part of duodenum. The                            [id number]-[id number] ([id number]) Olympus therapeutic endoscope                            was introduced through the mouth, and advanced to                            the second part of duodenum. The [id number]                            ([id number]) Olympus Linear EUS was introduced through                            the mouth, and advanced to the duodenum for  ultrasound examination from the stomach and                            duodenum. The upper EUS was accomplished without                            difficulty. The patient tolerated the procedure. Scope In: Scope Out: Findings:      ENDOSCOPIC FINDING: :      No gross lesions were noted in the proximal esophagus and in the mid       esophagus.      One tongue of salmon-colored mucosa was present from 35 to 37 cm. No       other visible abnormalities were present. The maximum longitudinal       extent of these esophageal mucosal changes was 2 cm in length. Biopsies       were taken with a cold forceps for histology to rule out Barrett's.      The Z-line was irregular and was found 35 cm from the incisors.      A 2 cm hiatal hernia was present.      Extrinsic impression on the stomach was found on the posterior wall of       the gastric body within the first 10 cm of the stomach.      Striped  mild inflammation characterized by erythema was found in the       gastric body and in the gastric antrum.      One non-bleeding superficial gastric ulcer with a clean ulcer base       (Forrest Class III) was found in the prepyloric region of the stomach.       The lesion was 6 mm in largest dimension.      No other gross lesions were noted in the entire examined stomach.       Biopsies were taken with a cold forceps for histology and Helicobacter       pylori testing.      Patchy mildly congested mucosa without active bleeding and with no       stigmata of bleeding was found in the duodenal bulb, in the first       portion of the duodenum and in the second portion of the duodenum.       Biopsies for histology were taken with a cold forceps for evaluation of       celiac disease.      The major papilla was normal.      ENDOSONOGRAPHIC FINDING: :      An anechoic lesion suggestive of a cyst was identified in the pancreatic       body-tail region. It is not in obvious communication with the pancreatic       duct. The lesion measured 51 mm by 41 mm in maximal cross-sectional       diameter. There was a single compartment without septae. The outer wall       of the lesion was thick. There was no associated mass. There was       internal debris within the fluid-filled cavity. After the rest of the       EUS was complete and based on her previous symptoms and endoscopic       impression today, the decision was made to create a cystogastrostomy       using the AXIOS  stent system. Once an appropriate position in the       stomach was identified, the common wall between the stomach and the cyst       was interrogated utilizing color Doppler imaging to identify interposed       vessels. The stomach wall and the cyst were punctured under       endosonographic guidance using the AXIOS electrocautery device was       advanced into the cyst, and a 15 x 10 mm AXIOS stent was placed with the        flanges in close approximation to the walls of the cyst and the stomach       through the cystogastrostomy. The stent was successfully placed. A TTS       dilator was passed through the scope. Dilation with a 05-05-11 mm       balloon dilator was performed up to 12 mm. The cyst was entered using       the adult EGD scope and was noted to be partially filled with fluid.       This was suctioned for a total of 200 cc (fluid was aspirated and sent       for culture and gram stain). Some old blood products were noted within       the cyst cavity and lavaged. A 5 cm 7 Fr double pigtail stent was placed       into the pseudocyst through the AXIOS to allow adequate cyst drainage.      An anechoic lesion suggestive of a cyst was identified in the pancreatic       head. It is not in obvious communication with the pancreatic duct. The       lesion measured 10.1 mm by 6.4 mm in maximal cross-sectional diameter.       There was a single compartment without septae. The outer wall of the       lesion was not seen. There was no associated mass. There was internal       debris within the fluid-filled cavity.      Pancreatic parenchymal abnormalities were noted in the entire pancreas       that could be visualized. The pancreas body/tail region was difficult to       visualize due to the cyst compression in the area so subtle lesions       could have been missed. However, the parenchyma that was visualized       consisted of lobularity without honeycombing and hyperechoic strands.      There was no sign of significant endosonographic abnormality in the       common bile duct (3.0 mm) and in the common hepatic duct (5.0 mm). No       stones, no biliary sludge and ducts with regular contour were identified.      Endosonographic imaging in the visualized portion of the liver showed no       mass.      No malignant-appearing lymph nodes were visualized in the celiac region       (level 20), peripancreatic  region and porta hepatis region.      The celiac region was visualized. Impression:               EGD Impression:                           - No gross lesions  in esophagus proximally.                           - Salmon-colored mucosa suggestive of short-segment                            Barrett's esophagus in distal esophagus noted.                            Biopsied.                           - Z-line irregular, 35 cm from the incisors.                           - 2 cm hiatal hernia.                           - Extrinsic impression in the gastric body                            (posterior wall).                           - Gastritis. Non-bleeding gastric ulcer with a                            clean ulcer base (Forrest Class III). Biopsied.                           - Congested duodenal mucosa. Biopsied.                           - Normal major papilla.                           EUS Impression:                           - A cystic lesion was seen in the pancreatic body                            and pancreatic tail. Tissue has not been obtained.                            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                            Cystgastrostomy created with AXIOS stent. Fluid  aspirated (total of 200 cc removed). Cyst cavity                            with old blood products but otherwise healthy                            appearance to the wall. Double pigtail placed                            through AXIOS stent to allow adequate drainage.                           - A small cystic lesion was seen in the pancreatic                            head. Tissue has not been obtained. However, the                            endosonographic appearance is suspicious for a                            pancreatic  pseudocyst with possible walled off                            necrosis/debris (too small for drainage or                            aspiration attempt currently but will need                            monitoring.                           - Pancreatic parenchymal abnormalities consisting                            of lobularity and hyperechoic strands were noted in                            the entire pancreas. Pancreatic body/tail region                            more difficult to visualize due to the cyst                            compression and will need follow up.                           - There was no sign of significant pathology in the                            common bile duct and in the common hepatic duct.                           -  No malignant-appearing lymph nodes were                            visualized in the celiac region (level 20),                            peripancreatic region and porta hepatis region. Moderate Sedation:      Not Applicable - Patient had care per Anesthesia. Recommendation:           - The patient will be observed post-procedure,                            until all discharge criteria are met.                           - Discharge patient to home.                           - Patient has a contact number available for                            emergencies. The signs and symptoms of potential                            delayed complications were discussed with the                            patient. Return to normal activities tomorrow.                            Written discharge instructions were provided to the                            patient.                           - Full liquid diet today.                           - Ciprofloxacin 500 mg twice daily for next few                            weeks while ensuring cyst drains adequately.                           - Observe patient's clinical course.                           -  CT-Abdomen to be scheduled in 2-weeks to ensure                            adequate cyst drainage and hopeful to then remove                            the stent within the next 4-weeks.                           -  Observe patient's clinical course.                           - Await cytology results and await path results.                           - Once CT scan has been completed and we know that                            cyst has decreased in size, then would recommend                            initiating PPI therapy to heal the stomach ulcer                            which will require follow up in a few months to                            ensure complete healing.                           - Minimize NSAIDs.                           - Monitor for signs/symptoms of bleeding,                            perforation, pancreatitis, and infection. If issues                            please call our number to get further assistance as                            needed.                           - The findings and recommendations were discussed                            with the patient.                           - The findings and recommendations were discussed                            with the designated responsible adult. Procedure Code(s):        --- Professional ---                           [phone], Esophagogastroduodenoscopy, flexible,                            transoral; with transmural drainage of pseudocyst                            (includes  placement of transmural drainage                            catheter[s]/stent[s], when performed, and                            endoscopic ultrasound, when performed)                           43237, Esophagogastroduodenoscopy, flexible,                            transoral; with endoscopic ultrasound examination                            limited to the esophagus, stomach or duodenum, and                            adjacent  structures                           43245, Esophagogastroduodenoscopy, flexible,                            transoral; with dilation of gastric/duodenal                            stricture(s) (eg, balloon, bougie)                           38182, Unlisted procedure, pancreas Diagnosis Code(s):        --- Professional ---                           K22.8, Other specified diseases of esophagus                           K44.9, Diaphragmatic hernia without obstruction or                            gangrene                           K31.89, Other diseases of stomach and duodenum                           K29.70, Gastritis, unspecified, without bleeding                           K25.9, Gastric ulcer, unspecified as acute or                            chronic, without hemorrhage or perforation                           K86.2, Cyst of pancreas  K86.9, Disease of pancreas, unspecified                           I89.9, Noninfective disorder of lymphatic vessels                            and lymph nodes, unspecified                           K85.90, Acute pancreatitis without necrosis or                            infection, unspecified                           R68.81, Early satiety                           R11.0, Nausea CPT copyright 2019 [employer]. All rights reserved. The codes documented in this report are preliminary and upon coder review may  be revised to meet current compliance requirements. Justice Britain, MD 11/09/2020 11:13:58 AM Number of Addenda: 0

## 2020-11-09 NOTE — Anesthesia Postprocedure Evaluation (Signed)
Anesthesia Post Note  Patient: Insurance account manager  Procedure(s) Performed: UPPER ESOPHAGEAL ENDOSCOPIC ULTRASOUND (EUS) (N/A ) PANCREATIC STENT PLACEMENT BIOPSY BALLOON DILATION (N/A ) CYST GASTROSTOMY     Patient location during evaluation: Endoscopy Anesthesia Type: General Level of consciousness: awake and alert Pain management: pain level controlled Vital Signs Assessment: post-procedure vital signs reviewed and stable Respiratory status: spontaneous breathing, nonlabored ventilation and respiratory function stable Cardiovascular status: blood pressure returned to baseline and stable Postop Assessment: no apparent nausea or vomiting Anesthetic complications: no   No complications documented.  Last Vitals:  Vitals:   11/09/20 1120 11/09/20 1129  BP: (!) 148/89 138/90  Pulse: 80 76  Resp: 18 18  Temp:    SpO2: 99% 96%    Last Pain:  Vitals:   11/09/20 1129  TempSrc:   PainSc: 0-No pain                 Jalea Bronaugh,W. EDMOND

## 2020-11-09 NOTE — Transfer of Care (Signed)
Immediate Anesthesia Transfer of Care Note  Patient: Sharon Duncan  Procedure(s) Performed: UPPER ESOPHAGEAL ENDOSCOPIC ULTRASOUND (EUS) (N/A ) PANCREATIC STENT PLACEMENT BIOPSY BALLOON DILATION (N/A ) CYST GASTROSTOMY  Patient Location: PACU  Anesthesia Type:General  Level of Consciousness: awake, alert , oriented and patient cooperative  Airway & Oxygen Therapy: Patient Spontanous Breathing and Patient connected to face mask oxygen  Post-op Assessment: Report given to RN, Post -op Vital signs reviewed and stable and Patient moving all extremities X 4  Post vital signs: Reviewed and stable  Last Vitals:  Vitals Value Taken Time  BP    Temp    Pulse 84 11/09/20 1054  Resp 17 11/09/20 1054  SpO2 100 % 11/09/20 1054  Vitals shown include unvalidated device data.  Last Pain:  Vitals:   11/09/20 0750  TempSrc: Oral  PainSc: 0-No pain         Complications: No complications documented.

## 2020-11-09 NOTE — Discharge Instructions (Signed)
YOU HAD AN ENDOSCOPIC PROCEDURE TODAY: Refer to the procedure report and other information in the discharge instructions given to you for any specific questions about what was found during the examination. If this information does not answer your questions, please call [place] office at [phone] to clarify.   YOU SHOULD EXPECT: Some feelings of bloating in the abdomen. Passage of more gas than usual. Walking can help get rid of the air that was put into your GI tract during the procedure and reduce the bloating. If you had a lower endoscopy (such as a colonoscopy or flexible sigmoidoscopy) you may notice spotting of blood in your stool or on the toilet paper. Some abdominal soreness may be present for a day or two, also.  DIET: Your first meal following the procedure should be a light meal and then it is ok to progress to your normal diet. A half-sandwich or bowl of soup is an example of a good first meal. Heavy or fried foods are harder to digest and may make you feel nauseous or bloated. Drink plenty of fluids but you should avoid alcoholic beverages for 24 hours. If you had a esophageal dilation, please see attached instructions for diet.    ACTIVITY: Your care partner should take you home directly after the procedure. You should plan to take it easy, moving slowly for the rest of the day. You can resume normal activity the day after the procedure however YOU SHOULD NOT DRIVE, use power tools, machinery or perform tasks that involve climbing or major physical exertion for 24 hours (because of the sedation medicines used during the test).   SYMPTOMS TO REPORT IMMEDIATELY: A gastroenterologist can be reached at any hour. Please call [phone]  for any of the following symptoms:   Following upper endoscopy (EGD, EUS, ERCP, esophageal dilation) Vomiting of blood or coffee ground material  New, significant abdominal pain  New, significant chest pain or pain under the shoulder blades  Painful or  persistently difficult swallowing  New shortness of breath  Black, tarry-looking or red, bloody stools  FOLLOW UP:  If any biopsies were taken you will be contacted by phone or by letter within the next 1-3 weeks. Call [phone]  if you have not heard about the biopsies in 3 weeks.  Please also call with any specific questions about appointments or follow up tests.  

## 2020-11-09 NOTE — H&P (Signed)
GASTROENTEROLOGY PROCEDURE H&P NOTE   Primary Care Physician: Suzan Slick, MD  HPI: Sharon Duncan is a 30 y.o. female who presents for EGD/EUS with possible cyst aspiration vs cystgastrostomy creation for pancreatic cyst NOS.  Past Medical History:  Diagnosis Date  . CKD (chronic kidney disease), stage III (HCC) 03/10/2018  . Depression   . Diabetes mellitus type 2, controlled (HCC)   . Frequent UTI   . Hypertension   . Obesity hypoventilation syndrome (HCC) 03/10/2018  . Pancreatic pseudocyst   . Pancreatitis due to common bile duct stone   . Paresthesia   . Vitamin D deficiency    Past Surgical History:  Procedure Laterality Date  . CHOLECYSTECTOMY    . KIDNEY SURGERY  2004   ?to lift kidney? d/t frequent UTIs  . TOOTH EXTRACTION N/A 03/03/2014   Procedure: EXTRACTION OF WISDOM TEETH;  Surgeon: Georgia Lopes, DDS;  Location[facility] OR;  Service: Oral Surgery;  Laterality: N/A;   Current Facility-Administered Medications  Medication Dose Route Frequency Provider Last Rate Last Admin  . 0.9 %  sodium chloride infusion   Intravenous Continuous Mansouraty, Netty Starring., MD       Allergies  Allergen Reactions  . Rocephin [Ceftriaxone] Rash   Family History  Problem Relation Age of Onset  . Lung disease Mother   . Other Father        died in car accident when patient was 30 years old  . Esophageal cancer Neg Hx   . Pancreatic cancer Neg Hx   . Colon cancer Neg Hx   . Stomach cancer Neg Hx   . Inflammatory bowel disease Neg Hx   . Liver disease Neg Hx   . Rectal cancer Neg Hx    Social History   Socioeconomic History  . Marital status: Single    Spouse name: Not on file  . Number of children: 4  . Years of education: some college  . Highest education level: Some college, no degree  Occupational History  . Occupation: stay at home mom  Tobacco Use  . Smoking status: Never Smoker  . Smokeless tobacco: Never Used  Vaping Use  . Vaping Use: Never used   Substance and Sexual Activity  . Alcohol use: Not Currently  . Drug use: No  . Sexual activity: Yes    Birth control/protection: Condom  Other Topics Concern  . Not on file  Social History Narrative   Lives at home with husband and four children.   Right-handed.   No daily caffeine use.   Social Determinants of Health   Financial Resource Strain: Not on file  Food Insecurity: Not on file  Transportation Needs: Not on file  Physical Activity: Not on file  Stress: Not on file  Social Connections: Not on file  Intimate Partner Violence: Not on file    Physical Exam: Vital signs in last 24 hours:     GEN: NAD EYE: Sclerae anicteric ENT: MMM CV: Non-tachycardic GI: Soft, NT/ND NEURO:  Alert & Oriented x 3  Lab Results: No results for input(s): WBC, HGB, HCT, PLT in the last 72 hours. BMET No results for input(s): NA, K, CL, CO2, GLUCOSE, BUN, CREATININE, CALCIUM in the last 72 hours. LFT No results for input(s): PROT, ALBUMIN, AST, ALT, ALKPHOS, BILITOT, BILIDIR, IBILI in the last 72 hours. PT/INR No results for input(s): LABPROT, INR in the last 72 hours.   Impression / Plan: This is a 30 y.o.female who presents for EGD/EUS with  possible cyst aspiration vs cystgastrostomy creation for pancreatic cyst NOS.  The risks of an EUS including intestinal perforation, bleeding, infection, aspiration, and medication effects were discussed as was the possibility it may not give a definitive diagnosis if a biopsy is performed.  When a biopsy of the pancreas is done as part of the EUS, there is an additional risk of pancreatitis at the rate of about 1-2%.  It was explained that procedure related pancreatitis is typically mild, although it can be severe and even life threatening, which is why we do not perform random pancreatic biopsies and only biopsy a lesion/area we feel is concerning enough to warrant the risk.  The risks and benefits of endoscopic evaluation were discussed with  the patient; these include but are not limited to the risk of perforation, infection, bleeding, missed lesions, lack of diagnosis, severe illness requiring hospitalization, as well as anesthesia and sedation related illnesses.  The patient is agreeable to proceed.    Corliss Parish, MD [facility] Office # [phone]

## 2020-11-09 NOTE — H&P (View-Only) (Signed)
 GASTROENTEROLOGY PROCEDURE H&P NOTE   Primary Care Physician: Rucker, Alethea Y, MD  HPI: Sharon Duncan is a 30 y.o. female who presents for EGD/EUS with possible cyst aspiration vs cystgastrostomy creation for pancreatic cyst NOS.  Past Medical History:  Diagnosis Date  . CKD (chronic kidney disease), stage III (HCC) 03/10/2018  . Depression   . Diabetes mellitus type 2, controlled (HCC)   . Frequent UTI   . Hypertension   . Obesity hypoventilation syndrome (HCC) 03/10/2018  . Pancreatic pseudocyst   . Pancreatitis due to common bile duct stone   . Paresthesia   . Vitamin D deficiency    Past Surgical History:  Procedure Laterality Date  . CHOLECYSTECTOMY    . KIDNEY SURGERY  2004   ?to lift kidney? d/t frequent UTIs  . TOOTH EXTRACTION N/A 03/03/2014   Procedure: EXTRACTION OF WISDOM TEETH;  Surgeon: Scott M Jensen, DDS;  Location: [facility] OR;  Service: Oral Surgery;  Laterality: N/A;   Current Facility-Administered Medications  Medication Dose Route Frequency Provider Last Rate Last Admin  . 0.9 %  sodium chloride infusion   Intravenous Continuous Mansouraty, Ismaeel Arvelo Jr., MD       Allergies  Allergen Reactions  . Rocephin [Ceftriaxone] Rash   Family History  Problem Relation Age of Onset  . Lung disease Mother   . Other Father        died in car accident when patient was 30 years old  . Esophageal cancer Neg Hx   . Pancreatic cancer Neg Hx   . Colon cancer Neg Hx   . Stomach cancer Neg Hx   . Inflammatory bowel disease Neg Hx   . Liver disease Neg Hx   . Rectal cancer Neg Hx    Social History   Socioeconomic History  . Marital status: Single    Spouse name: Not on file  . Number of children: 4  . Years of education: some college  . Highest education level: Some college, no degree  Occupational History  . Occupation: stay at home mom  Tobacco Use  . Smoking status: Never Smoker  . Smokeless tobacco: Never Used  Vaping Use  . Vaping Use: Never used   Substance and Sexual Activity  . Alcohol use: Not Currently  . Drug use: No  . Sexual activity: Yes    Birth control/protection: Condom  Other Topics Concern  . Not on file  Social History Narrative   Lives at home with husband and four children.   Right-handed.   No daily caffeine use.   Social Determinants of Health   Financial Resource Strain: Not on file  Food Insecurity: Not on file  Transportation Needs: Not on file  Physical Activity: Not on file  Stress: Not on file  Social Connections: Not on file  Intimate Partner Violence: Not on file    Physical Exam: Vital signs in last 24 hours:     GEN: NAD EYE: Sclerae anicteric ENT: MMM CV: Non-tachycardic GI: Soft, NT/ND NEURO:  Alert & Oriented x 3  Lab Results: No results for input(s): WBC, HGB, HCT, PLT in the last 72 hours. BMET No results for input(s): NA, K, CL, CO2, GLUCOSE, BUN, CREATININE, CALCIUM in the last 72 hours. LFT No results for input(s): PROT, ALBUMIN, AST, ALT, ALKPHOS, BILITOT, BILIDIR, IBILI in the last 72 hours. PT/INR No results for input(s): LABPROT, INR in the last 72 hours.   Impression / Plan: This is a 30 y.o.female who presents for EGD/EUS with   possible cyst aspiration vs cystgastrostomy creation for pancreatic cyst NOS.  The risks of an EUS including intestinal perforation, bleeding, infection, aspiration, and medication effects were discussed as was the possibility it may not give a definitive diagnosis if a biopsy is performed.  When a biopsy of the pancreas is done as part of the EUS, there is an additional risk of pancreatitis at the rate of about 1-2%.  It was explained that procedure related pancreatitis is typically mild, although it can be severe and even life threatening, which is why we do not perform random pancreatic biopsies and only biopsy a lesion/area we feel is concerning enough to warrant the risk.  The risks and benefits of endoscopic evaluation were discussed with  the patient; these include but are not limited to the risk of perforation, infection, bleeding, missed lesions, lack of diagnosis, severe illness requiring hospitalization, as well as anesthesia and sedation related illnesses.  The patient is agreeable to proceed.    Corliss Parish, MD [facility] Office # [phone]

## 2020-11-09 NOTE — Anesthesia Procedure Notes (Signed)
Procedure Name: Intubation Date/Time: 11/09/2020 9:26 AM Performed by: Mitzie Na, CRNA Pre-anesthesia Checklist: Patient identified, Emergency Drugs available, Suction available and Patient being monitored Patient Re-evaluated:Patient Re-evaluated prior to induction Oxygen Delivery Method: Circle system utilized Preoxygenation: Pre-oxygenation with 100% oxygen Induction Type: IV induction Ventilation: Oral airway inserted - appropriate to patient size and Mask ventilation with difficulty Laryngoscope Size: Mac and 3 Grade View: Grade I Tube type: Oral Tube size: 7.0 mm Number of attempts: 1 Airway Equipment and Method: Stylet and Oral airway Placement Confirmation: ETT inserted through vocal cords under direct vision,  positive ETCO2 and breath sounds checked- equal and bilateral Secured at: 24 cm Tube secured with: Tape Dental Injury: Teeth and Oropharynx as per pre-operative assessment

## 2020-11-09 NOTE — Progress Notes (Signed)
Notified Dr Sampson Goon (anesthesia) that blood sugar at 15 minute check after 10 units of Novolog is 324.  No new orders given.

## 2020-11-11 ENCOUNTER — Encounter (HOSPITAL_COMMUNITY): Payer: Self-pay | Admitting: Gastroenterology

## 2020-11-11 LAB — BODY FLUID CULTURE W GRAM STAIN

## 2020-11-11 LAB — SURGICAL PATHOLOGY

## 2020-11-13 ENCOUNTER — Encounter: Payer: Self-pay | Admitting: Gastroenterology

## 2020-11-13 ENCOUNTER — Other Ambulatory Visit: Payer: Self-pay

## 2020-11-13 DIAGNOSIS — K863 Pseudocyst of pancreas: Secondary | ICD-10-CM

## 2020-11-13 DIAGNOSIS — K862 Cyst of pancreas: Secondary | ICD-10-CM

## 2020-11-19 LAB — ANAEROBIC CULTURE W GRAM STAIN

## 2020-11-19 NOTE — Telephone Encounter (Signed)
Amy can you help out with this?  Was she denied?

## 2020-11-23 ENCOUNTER — Ambulatory Visit (HOSPITAL_COMMUNITY): Payer: Medicaid Other

## 2020-11-24 ENCOUNTER — Ambulatory Visit (HOSPITAL_COMMUNITY): Payer: Medicaid Other

## 2020-11-25 ENCOUNTER — Other Ambulatory Visit: Payer: Self-pay

## 2020-11-25 NOTE — Progress Notes (Signed)
Attempted to obtain medical history via telephone, unable to reach at this time. I left a voicemail to return pre surgical testing department's phone call.  

## 2020-11-26 ENCOUNTER — Ambulatory Visit (HOSPITAL_COMMUNITY): Admission: RE | Admit: 2020-11-26 | Payer: Medicaid Other | Source: Ambulatory Visit

## 2020-11-26 ENCOUNTER — Ambulatory Visit (HOSPITAL_COMMUNITY)
Admission: RE | Admit: 2020-11-26 | Discharge: 2020-11-26 | Disposition: A | Discharge status: Home / Self Care | Payer: Medicaid Other | Source: Ambulatory Visit | Attending: Gastroenterology | Admitting: Gastroenterology

## 2020-11-26 ENCOUNTER — Other Ambulatory Visit: Payer: Self-pay | Admitting: Gastroenterology

## 2020-11-26 ENCOUNTER — Encounter (HOSPITAL_COMMUNITY): Payer: Self-pay

## 2020-11-26 ENCOUNTER — Other Ambulatory Visit (HOSPITAL_COMMUNITY)
Admission: RE | Admit: 2020-11-26 | Discharge: 2020-11-26 | Disposition: A | Discharge status: Home / Self Care | Payer: Medicaid Other | Source: Ambulatory Visit | Attending: Gastroenterology | Admitting: Gastroenterology

## 2020-11-26 DIAGNOSIS — N261 Atrophy of kidney (terminal): Secondary | ICD-10-CM | POA: Diagnosis not present

## 2020-11-26 DIAGNOSIS — Z20822 Contact with and (suspected) exposure to covid-19: Secondary | ICD-10-CM | POA: Insufficient documentation

## 2020-11-26 DIAGNOSIS — K862 Cyst of pancreas: Secondary | ICD-10-CM | POA: Diagnosis not present

## 2020-11-26 DIAGNOSIS — Z01812 Encounter for preprocedural laboratory examination: Secondary | ICD-10-CM | POA: Diagnosis present

## 2020-11-26 MED ORDER — SODIUM CHLORIDE (PF) 0.9 % IJ SOLN
INTRAMUSCULAR | Status: AC
  Filled 2020-11-26: qty 50

## 2020-11-26 MED ORDER — IOHEXOL 300 MG/ML  SOLN
100.0000 mL | Freq: Once | INTRAMUSCULAR | Status: AC | PRN
  Administered 2020-11-26: 100 mL via INTRAVENOUS

## 2020-11-27 LAB — SARS CORONAVIRUS 2 (TAT 6-24 HRS): SARS Coronavirus 2: NEGATIVE

## 2020-11-30 ENCOUNTER — Ambulatory Visit (HOSPITAL_COMMUNITY): Payer: Medicaid Other | Admitting: Anesthesiology

## 2020-11-30 ENCOUNTER — Encounter (HOSPITAL_COMMUNITY)
Admission: RE | Disposition: A | Discharge status: Home / Self Care | Payer: Self-pay | Source: Home / Self Care | Attending: Gastroenterology

## 2020-11-30 ENCOUNTER — Ambulatory Visit (HOSPITAL_COMMUNITY)
Admission: RE | Admit: 2020-11-30 | Discharge: 2020-11-30 | Disposition: A | Discharge status: Home / Self Care | Payer: Medicaid Other | Attending: Gastroenterology | Admitting: Gastroenterology

## 2020-11-30 ENCOUNTER — Other Ambulatory Visit: Payer: Self-pay

## 2020-11-30 ENCOUNTER — Encounter (HOSPITAL_COMMUNITY): Payer: Self-pay | Admitting: Gastroenterology

## 2020-11-30 DIAGNOSIS — Z836 Family history of other diseases of the respiratory system: Secondary | ICD-10-CM | POA: Diagnosis not present

## 2020-11-30 DIAGNOSIS — K279 Peptic ulcer, site unspecified, unspecified as acute or chronic, without hemorrhage or perforation: Secondary | ICD-10-CM | POA: Diagnosis not present

## 2020-11-30 DIAGNOSIS — Z881 Allergy status to other antibiotic agents status: Secondary | ICD-10-CM | POA: Diagnosis not present

## 2020-11-30 DIAGNOSIS — Z8711 Personal history of peptic ulcer disease: Secondary | ICD-10-CM | POA: Diagnosis present

## 2020-11-30 DIAGNOSIS — I129 Hypertensive chronic kidney disease with stage 1 through stage 4 chronic kidney disease, or unspecified chronic kidney disease: Secondary | ICD-10-CM | POA: Insufficient documentation

## 2020-11-30 DIAGNOSIS — Z4659 Encounter for fitting and adjustment of other gastrointestinal appliance and device: Secondary | ICD-10-CM

## 2020-11-30 DIAGNOSIS — E1122 Type 2 diabetes mellitus with diabetic chronic kidney disease: Secondary | ICD-10-CM | POA: Diagnosis not present

## 2020-11-30 DIAGNOSIS — Z888 Allergy status to other drugs, medicaments and biological substances status: Secondary | ICD-10-CM | POA: Diagnosis not present

## 2020-11-30 DIAGNOSIS — K3189 Other diseases of stomach and duodenum: Secondary | ICD-10-CM

## 2020-11-30 DIAGNOSIS — N183 Chronic kidney disease, stage 3 unspecified: Secondary | ICD-10-CM | POA: Insufficient documentation

## 2020-11-30 DIAGNOSIS — K449 Diaphragmatic hernia without obstruction or gangrene: Secondary | ICD-10-CM | POA: Insufficient documentation

## 2020-11-30 DIAGNOSIS — K863 Pseudocyst of pancreas: Secondary | ICD-10-CM | POA: Insufficient documentation

## 2020-11-30 DIAGNOSIS — K259 Gastric ulcer, unspecified as acute or chronic, without hemorrhage or perforation: Secondary | ICD-10-CM | POA: Insufficient documentation

## 2020-11-30 HISTORY — PX: ESOPHAGOGASTRODUODENOSCOPY (EGD) WITH PROPOFOL: SHX5813

## 2020-11-30 HISTORY — PX: STENT REMOVAL: SHX6421

## 2020-11-30 LAB — GLUCOSE, CAPILLARY
Glucose-Capillary: 340 mg/dL — ABNORMAL HIGH (ref 70–99)
Glucose-Capillary: 305 mg/dL — ABNORMAL HIGH (ref 70–99)
Glucose-Capillary: 256 mg/dL — ABNORMAL HIGH (ref 70–99)

## 2020-11-30 LAB — PREGNANCY, URINE: Preg Test, Ur: NEGATIVE

## 2020-11-30 SURGERY — ESOPHAGOGASTRODUODENOSCOPY (EGD) WITH PROPOFOL
Anesthesia: Monitor Anesthesia Care

## 2020-11-30 MED ORDER — PROPOFOL 500 MG/50ML IV EMUL
INTRAVENOUS | Status: DC | PRN
  Administered 2020-11-30: 125 ug/kg/min via INTRAVENOUS

## 2020-11-30 MED ORDER — LIDOCAINE 2% (20 MG/ML) 5 ML SYRINGE
INTRAMUSCULAR | Status: DC | PRN
  Administered 2020-11-30: 100 mg via INTRAVENOUS

## 2020-11-30 MED ORDER — OMEPRAZOLE 40 MG PO CPDR: 40.0000 mg | DELAYED_RELEASE_CAPSULE | Freq: Two times a day (BID) | ORAL | 4 refills | Status: AC

## 2020-11-30 MED ORDER — INSULIN ASPART 100 UNIT/ML IJ SOLN
INTRAMUSCULAR | Status: AC
  Filled 2020-11-30: qty 1

## 2020-11-30 MED ORDER — PROPOFOL 10 MG/ML IV BOLUS
INTRAVENOUS | Status: DC | PRN
  Administered 2020-11-30 (×3): 20 mg via INTRAVENOUS

## 2020-11-30 MED ORDER — INSULIN ASPART 100 UNIT/ML IJ SOLN
10.0000 [IU] | Freq: Once | INTRAMUSCULAR | Status: AC
  Administered 2020-11-30: 10 [IU] via SUBCUTANEOUS

## 2020-11-30 MED ORDER — ONDANSETRON HCL 4 MG/2ML IJ SOLN
INTRAMUSCULAR | Status: DC | PRN
  Administered 2020-11-30: 4 mg via INTRAVENOUS

## 2020-11-30 MED ORDER — PROPOFOL 500 MG/50ML IV EMUL
INTRAVENOUS | Status: AC
  Filled 2020-11-30: qty 50

## 2020-11-30 MED ORDER — LACTATED RINGERS IV SOLN: INTRAVENOUS | Status: DC | PRN

## 2020-11-30 SURGICAL SUPPLY — 14 items

## 2020-11-30 NOTE — Op Note (Signed)
[facility] Patient Name: Sharon Duncan Procedure Date: 11/30/2020 MRN: [id number] Attending MD: Justice Britain , MD Date of Birth: 1991/03/19 CSN: [id number] Age: 30 Admit Type: Outpatient Procedure:                Upper GI endoscopy Indications:              Follow-up of peptic ulcer, Stent removal,                            Pancreatic pseudocyst Providers:                Justice Britain, MD, Baird Cancer, RN, Laverda Sorenson, Technician, Danley Danker, CRNA Referring MD:             Maylon Peppers, Catalina Antigua, MD Medicines:                Monitored Anesthesia Care Complications:            No immediate complications. Estimated Blood Loss:     Estimated blood loss was minimal. Procedure:                Pre-Anesthesia Assessment:                           - Prior to the procedure, a History and Physical                            was performed, and patient medications and                            allergies were reviewed. The patient's tolerance of                            previous anesthesia was also reviewed. The risks                            and benefits of the procedure and the sedation                            options and risks were discussed with the patient.                            All questions were answered, and informed consent                            was obtained. Prior Anticoagulants: The patient has                            taken no previous anticoagulant or antiplatelet                            agents except for NSAID medication. ASA Grade  Assessment: III - A patient with severe systemic                            disease. After reviewing the risks and benefits,                            the patient was deemed in satisfactory condition to                            undergo the procedure.                           After obtaining informed consent, the endoscope was                             passed under direct vision. Throughout the                            procedure, the patient's blood pressure, pulse, and                            oxygen saturations were monitored continuously. The                            [id number] ([id number]) Olympus therapeutic endoscope                            was introduced through the mouth, and advanced to                            the second part of duodenum. The upper GI endoscopy                            was accomplished without difficulty. The patient                            tolerated the procedure. Scope In: Scope Out: Findings:      No gross lesions were noted in the proximal esophagus and in the mid       esophagus.      One tongue of salmon-colored mucosa was present from 35 to 37 cm. No       other visible abnormalities were present. Previously biopsied and       negative for Barrett's so not rebiopsied.      The Z-line was irregular and was found 37 cm from the incisors.      A 2 cm hiatal hernia was present.      A previously placed AXIOS cystgastrostomy stent was found on the       posterior wall of the stomach. The stent was removed from the       cystgastrostomy with a rat-toothed forceps. The cystgastrostomy tract       was entered under direct vision. The cyst was showing no evidence of       necrotic debris. A large amount of pink, viable tissue was found within       the pseudocyst  on direct vision and there was evidence of some oozing       from the site after AXIOS removal, suggestive of appropriate mucosal       healing. The oozing slowed down.      Patchy moderately erythematous mucosa without bleeding was found in the       entire examined stomach - previously biopsied and negative for HP so not       rebiopsied. PUD is improving however.      No gross lesions were noted in the duodenal bulb, in the first portion       of the duodenum and in the second portion of the duodenum. Impression:                - No gross lesions in esophagus proximally.                            Salmon-colored mucosa - negative for Barrett's                            previously.                           - Z-line irregular, 37 cm from the incisors.                           - 2 cm hiatal hernia.                           - AXIOS cystgastrostomy stent in place and removed.                            Some oozing occured/noted but was slowing at time                            of procedure completion.                           - Cystwall is viable.                           - Erythematous mucosa in the stomach - previously                            biopsied. PUD noted last time is healing.                           - No gross lesions in the duodenal bulb, in the                            first portion of the duodenum and in the second                            portion of the duodenum. Moderate Sedation:      Not Applicable - Patient had care per Anesthesia. Recommendation:           - The patient will be observed post-procedure,  until all discharge criteria are met.                           - Discharge patient to home.                           - Patient has a contact number available for                            emergencies. The signs and symptoms of potential                            delayed complications were discussed with the                            patient. Return to normal activities tomorrow.                            Written discharge instructions were provided to the                            patient.                           - Low fat diet.                           - Start Omeprazole 40 mg twice daily for 1 month                            and then decrease to 40 mg daily thereafter.                           - Observe patient's clinical course.                           - Recommend repeat CT in 53-month to ensure that                             tail cyst does not recur. If there is evidence of                            significant recurrence then question of pancreatic                            duct disruption will need to be had.                           - Recommend a 676-monthRI/MRCP to re-evaluate the                            pancreatic head cyst and if stable then 1-year  follow up thereafter.                           - Further workup/evaluation as per Dr. Jenetta Downer in                            regards to consideration of genetics testing                            (SPINK/PRSS-1/CFTR) is not unreasonable due to                            recurrent idiopathic pancreatitis.                           - The findings and recommendations were discussed                            with the patient.                           - The findings and recommendations were discussed                            with the patient's family. Procedure Code(s):        --- Professional ---                           [phone], Esophagogastroduodenoscopy, flexible,                            transoral; diagnostic, including collection of                            specimen(s) by brushing or washing, when performed                            (separate procedure)                           [phone], Unlisted procedure, pancreas Diagnosis Code(s):        --- Professional ---                           K22.8, Other specified diseases of esophagus                           K44.9, Diaphragmatic hernia without obstruction or                            gangrene                           Z97.8, Presence of other specified devices                           K31.89, Other diseases of stomach and duodenum  K27.9, Peptic ulcer, site unspecified, unspecified                            as acute or chronic, without hemorrhage or                            perforation                           Z46.59, Encounter for  fitting and adjustment of                            other gastrointestinal appliance and device                           K86.3, Pseudocyst of pancreas CPT copyright 2019 American [employer] Association. All rights reserved. The codes documented in this report are preliminary and upon coder review may  be revised to meet current compliance requirements. Justice Britain, MD 11/30/2020 9:37:56 AM Number of Addenda: 0

## 2020-11-30 NOTE — Transfer of Care (Signed)
Immediate Anesthesia Transfer of Care Note  Patient: Sharon Duncan  Procedure(s) Performed: ESOPHAGOGASTRODUODENOSCOPY (EGD) WITH PROPOFOL (N/A ) STENT REMOVAL  Patient Location: Endoscopy Unit  Anesthesia Type:MAC  Level of Consciousness: awake  Airway & Oxygen Therapy: Patient Spontanous Breathing and Patient connected to face mask oxygen  Post-op Assessment: Report given to RN and Post -op Vital signs reviewed and stable  Post vital signs: Reviewed and stable  Last Vitals:  Vitals Value Taken Time  BP    Temp    Pulse 114 11/30/20 0920  Resp 25 11/30/20 0920  SpO2 99 % 11/30/20 0920  Vitals shown include unvalidated device data.  Last Pain:  Vitals:   11/30/20 0751  TempSrc: Temporal  PainSc: 0-No pain         Complications: No complications documented.

## 2020-11-30 NOTE — Interval H&P Note (Signed)
History and Physical Interval Note:  11/30/2020 7:53 AM  Sharon Duncan  has presented today for surgery, with the diagnosis of pancreatic cyst.  The various methods of treatment have been discussed with the patient and family. After consideration of risks, benefits and other options for treatment, the patient has consented to  Procedure(s) with comments: ESOPHAGOGASTRODUODENOSCOPY (EGD) WITH PROPOFOL (N/A) - 1 hour case  as a surgical intervention.  The patient's history has been reviewed, patient examined, no change in status, stable for surgery.  I have reviewed the patient's chart and labs.  Questions were answered to the patient's satisfaction.    Plan for AXIOS stent removal today.   [employer]

## 2020-11-30 NOTE — Discharge Instructions (Signed)
YOU HAD AN ENDOSCOPIC PROCEDURE TODAY: Refer to the procedure report and other information in the discharge instructions given to you for any specific questions about what was found during the examination. If this information does not answer your questions, please call [facility] office at [phone] to clarify.   YOU SHOULD EXPECT: Some feelings of bloating in the abdomen. Passage of more gas than usual. Walking can help get rid of the air that was put into your GI tract during the procedure and reduce the bloating. If you had a lower endoscopy (such as a colonoscopy or flexible sigmoidoscopy) you may notice spotting of blood in your stool or on the toilet paper. Some abdominal soreness may be present for a day or two, also.  DIET: Your first meal following the procedure should be a light meal and then it is ok to progress to your normal diet. A half-sandwich or bowl of soup is an example of a good first meal. Heavy or fried foods are harder to digest and may make you feel nauseous or bloated. Drink plenty of fluids but you should avoid alcoholic beverages for 24 hours. If you had a esophageal dilation, please see attached instructions for diet.    ACTIVITY: Your care partner should take you home directly after the procedure. You should plan to take it easy, moving slowly for the rest of the day. You can resume normal activity the day after the procedure however YOU SHOULD NOT DRIVE, use power tools, machinery or perform tasks that involve climbing or major physical exertion for 24 hours (because of the sedation medicines used during the test).   SYMPTOMS TO REPORT IMMEDIATELY: A gastroenterologist can be reached at any hour. Please call [phone]  for any of the following symptoms:   Following upper endoscopy (EGD, EUS, ERCP, esophageal dilation) Vomiting of blood or coffee ground material  New, significant abdominal pain  New, significant chest pain or pain under the shoulder blades  Painful or  persistently difficult swallowing  New shortness of breath  Black, tarry-looking or red, bloody stools  FOLLOW UP:  If any biopsies were taken you will be contacted by phone or by letter within the next 1-3 weeks. Call [phone]  if you have not heard about the biopsies in 3 weeks.  Please also call with any specific questions about appointments or follow up tests.  

## 2020-11-30 NOTE — Anesthesia Postprocedure Evaluation (Signed)
Anesthesia Post Note  Patient: Sharon Duncan  Procedure(s) Performed: ESOPHAGOGASTRODUODENOSCOPY (EGD) WITH PROPOFOL (N/A ) STENT REMOVAL     Patient location during evaluation: PACU Anesthesia Type: MAC Level of consciousness: awake and alert Pain management: pain level controlled Vital Signs Assessment: post-procedure vital signs reviewed and stable Respiratory status: spontaneous breathing, nonlabored ventilation, respiratory function stable and patient connected to nasal cannula oxygen Cardiovascular status: stable and blood pressure returned to baseline Postop Assessment: no apparent nausea or vomiting Anesthetic complications: no   No complications documented.  Last Vitals:  Vitals:   11/30/20 0940 11/30/20 0950  BP: 122/71 122/71  Pulse: 93 90  Resp: 19 18  Temp:    SpO2: 97% 98%    Last Pain:  Vitals:   11/30/20 0950  TempSrc:   PainSc: 0-No pain                 Effie Berkshire

## 2020-11-30 NOTE — Anesthesia Preprocedure Evaluation (Addendum)
Anesthesia Evaluation  Patient identified by MRN, date of birth, ID band Patient awake    Reviewed: Allergy & Precautions, NPO status , Patient's Chart, lab work & pertinent test results  Airway Mallampati: III  TM Distance: >3 FB Neck ROM: Full    Dental  (+) Teeth Intact, Dental Advisory Given   Pulmonary neg pulmonary ROS,    breath sounds clear to auscultation       Cardiovascular hypertension,  Rhythm:Regular Rate:Normal     Neuro/Psych PSYCHIATRIC DISORDERS Depression  Neuromuscular disease    GI/Hepatic negative GI ROS, Neg liver ROS,   Endo/Other  diabetes, Type 2, Oral Hypoglycemic Agents  Renal/GU CRFRenal disease     Musculoskeletal   Abdominal (+) + obese,   Peds  Hematology   Anesthesia Other Findings   Reproductive/Obstetrics                            Anesthesia Physical Anesthesia Plan  ASA: III  Anesthesia Plan: MAC   Post-op Pain Management:    Induction: Intravenous  PONV Risk Score and Plan: 0 and Propofol infusion  Airway Management Planned: Natural Airway and Nasal Cannula  Additional Equipment: None  Intra-op Plan:   Post-operative Plan:   Informed Consent: I have reviewed the patients History and Physical, chart, labs and discussed the procedure including the risks, benefits and alternatives for the proposed anesthesia with the patient or authorized representative who has indicated his/her understanding and acceptance.       Plan Discussed with: CRNA  Anesthesia Plan Comments:         Anesthesia Quick Evaluation

## 2020-12-01 ENCOUNTER — Encounter (HOSPITAL_COMMUNITY): Payer: Self-pay | Admitting: Gastroenterology

## 2022-04-17 IMAGING — CT CT ABDOMEN W/ CM
2 of 4 series · 15 of 46 positions shown, 17 images · IV contrast (APPLIED)
Comparison: 09/10/2020

CLINICAL DATA: Pancreatic cyst/pseudocyst, stent

EXAM:
CT ABDOMEN WITH CONTRAST
TECHNIQUE: Multidetector CT imaging of the abdomen was performed using the
standard protocol following bolus administration of intravenous
contrast.
CONTRAST:  100mL OMNIPAQUE IOHEXOL 300 MG/ML  SOLN

[Series 2: axial st · axial · 0.74mm/px · z∈[+1292,+1526]mm · 12 of 55 slices shown, 14 images]
[im 4/55  soft-tissue]
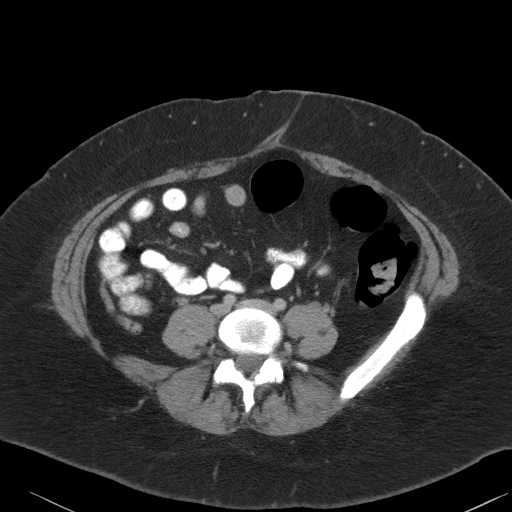
[im 4/55  bone]
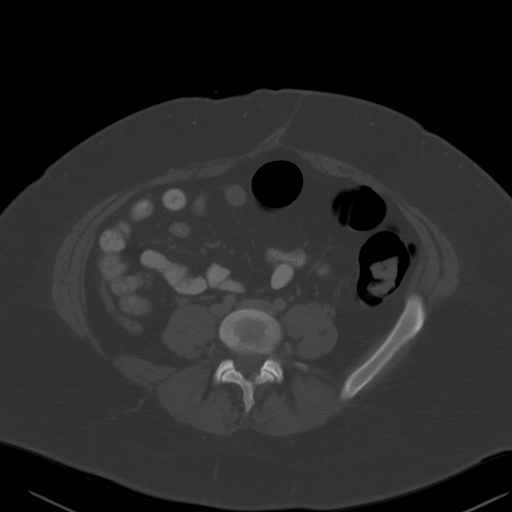
[im 8/55  soft-tissue]
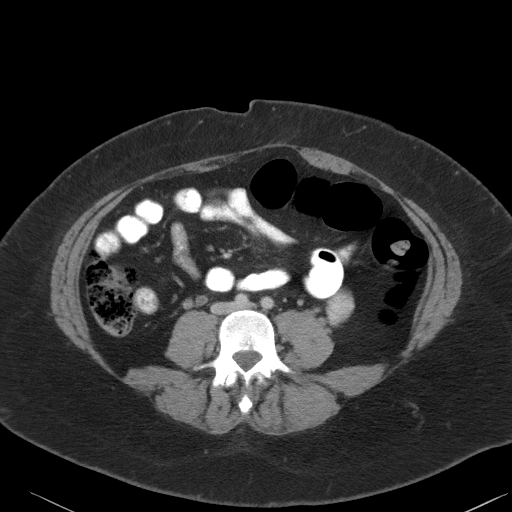
[im 11/55  soft-tissue]
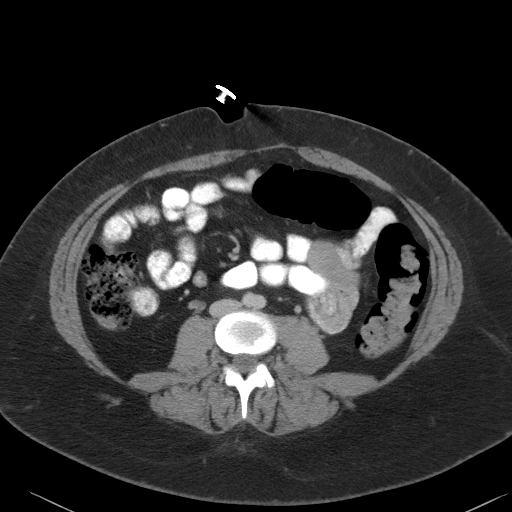
[im 19/55  soft-tissue]
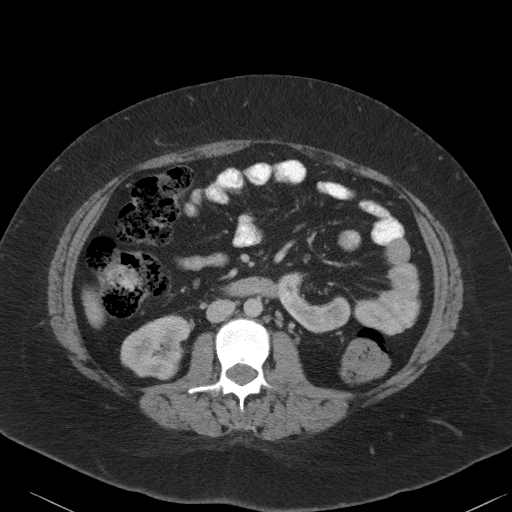
[im 22/55  soft-tissue]
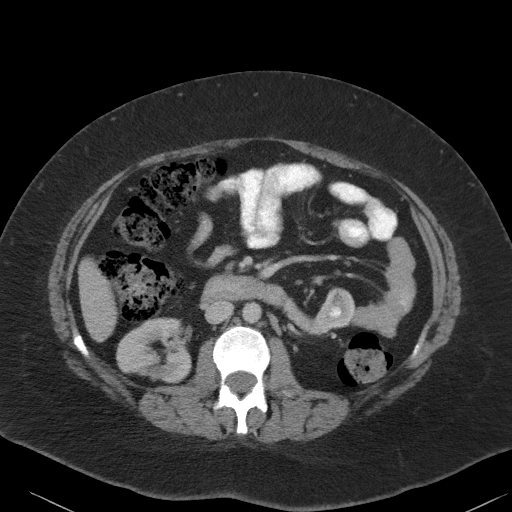
[im 26/55  soft-tissue]
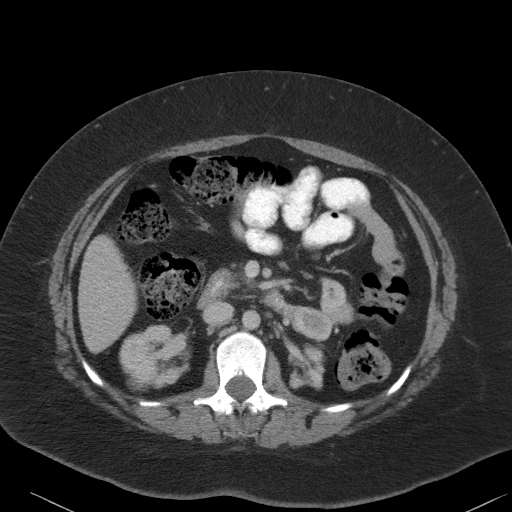
[im 29/55  soft-tissue]
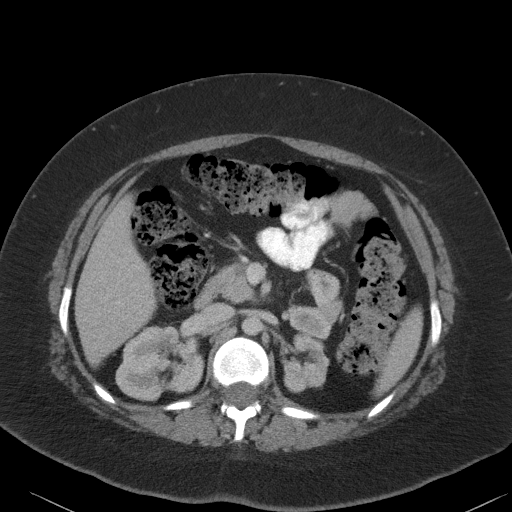
[im 33/55  soft-tissue]
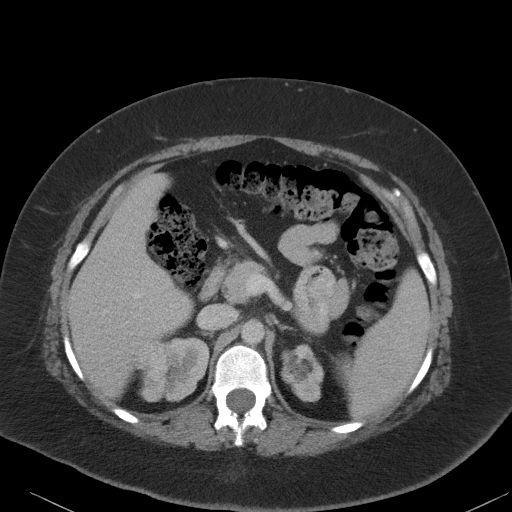
[im 37/55  soft-tissue]
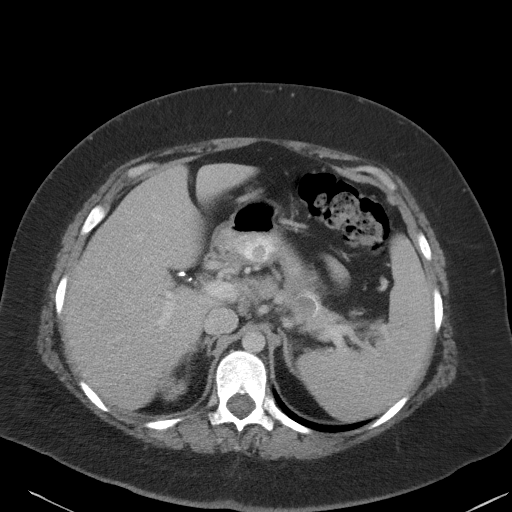
[im 37/55  bone]
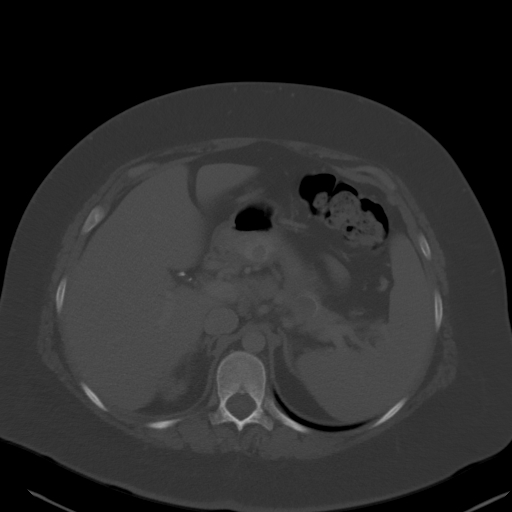
[im 44/55  soft-tissue]
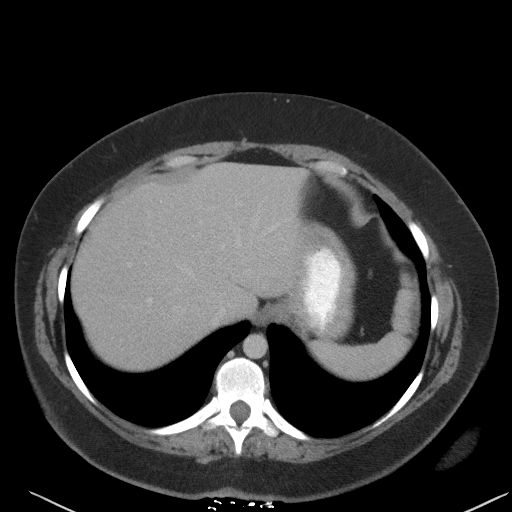
[im 47/55  soft-tissue]
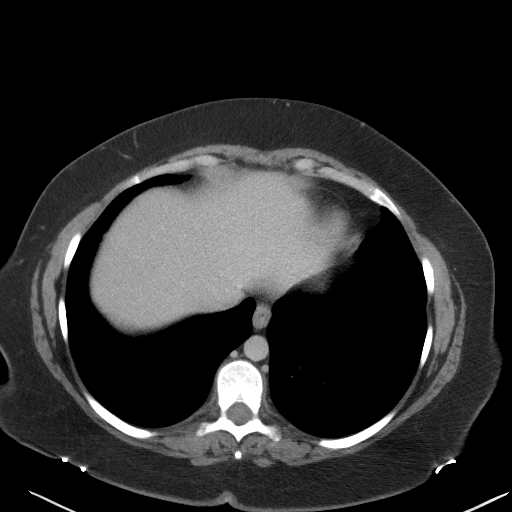
[im 51/55  soft-tissue]
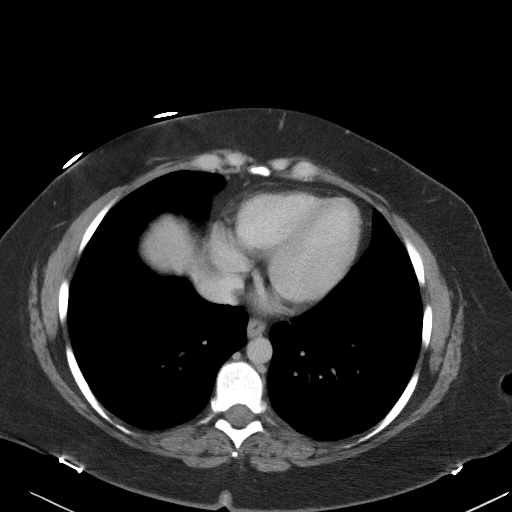

[Series 4: coronal st · coronal · 0.60mm/px · 3 of 87 slices shown]
[im 29/87  soft-tissue]
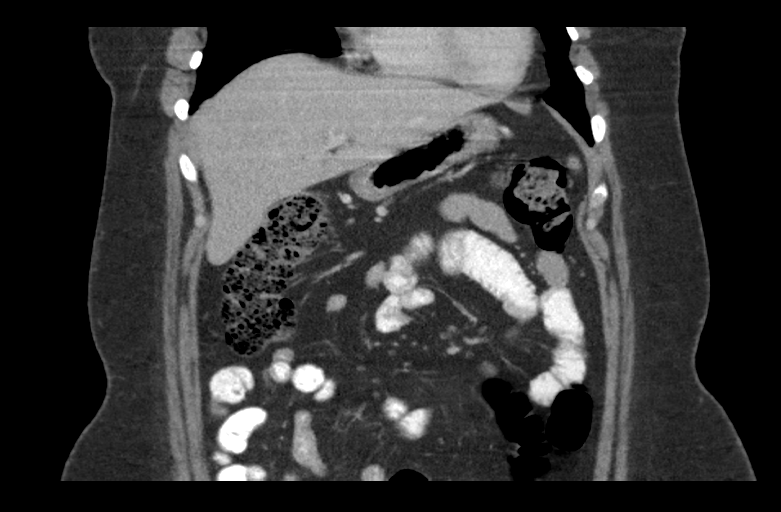
[im 39/87  soft-tissue]
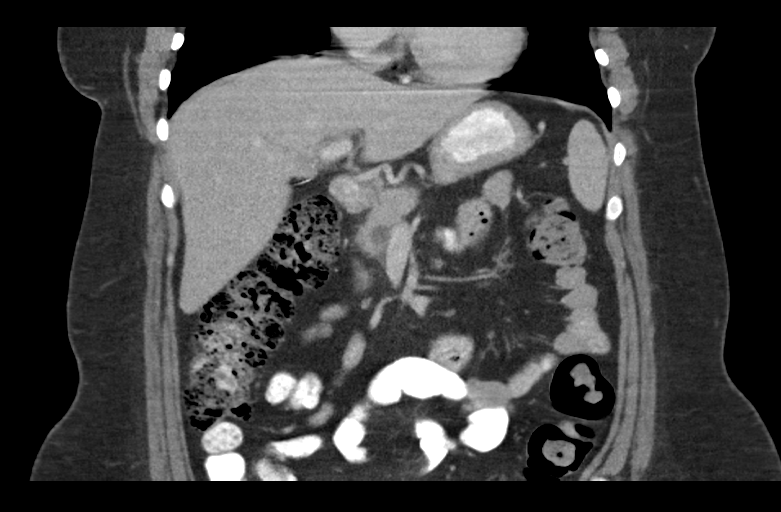
[im 48/87  soft-tissue]
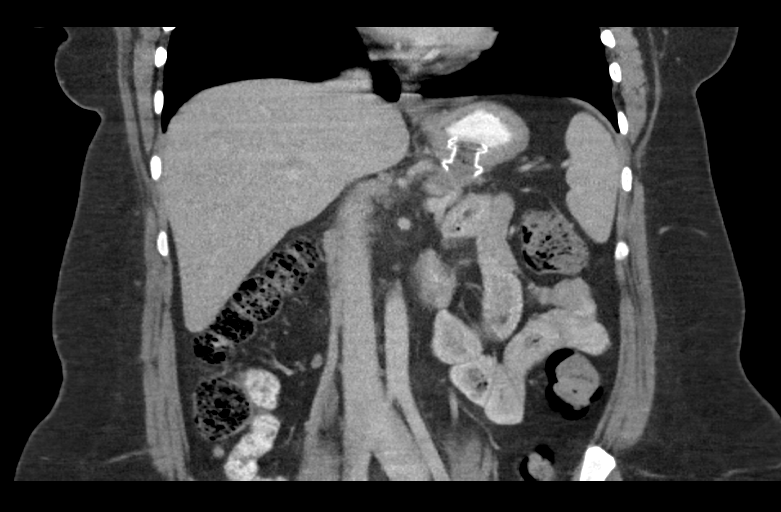

[15 of 46 positions shown; findings below may reference images not displayed]

FINDINGS: Lower chest: No acute abnormality.

Hepatobiliary: No focal liver abnormality is seen. Status post
cholecystectomy. No biliary dilatation.

Pancreas: Status post cystgastrostomy with complete interval
resolution of a previously seen fluid collection about the
pancreatic tail and impressing upon the gastric body. There is a
small, persistent fluid attenuation lesion in the anterior
pancreatic head measuring 0.9 x 0.8 cm, not significantly changed
(series 2, image 26). No pancreatic ductal dilatation or surrounding
inflammatory changes.

Spleen: Normal in size without focal abnormality.

Adrenals/Urinary Tract: Adrenal glands are unremarkable. Extensive
bilateral renal cortical scarring with a markedly atrophic left
kidney. Bladder is unremarkable.

Stomach/Bowel: Status post cystgastrostomy. Appendix appears normal.
No evidence of bowel wall thickening, distention, or inflammatory
changes.

Vascular/Lymphatic: No significant vascular findings are present. No
enlarged abdominal or pelvic lymph nodes.

Other: No abdominal wall hernia or abnormality. No abdominopelvic
ascites.

Musculoskeletal: No acute or significant osseous findings.
IMPRESSION: 1. Status post cystgastrostomy with complete interval resolution of
a previously seen fluid collection about the pancreatic tail and
impressing upon the gastric body.
2. There is a small, persistent fluid attenuation lesion in the
anterior pancreatic head measuring 0.9 x 0.8 cm, not significantly
changed. Findings are most consistent with an additional small
pseudocyst.
3. Extensive bilateral renal cortical scarring with a markedly
atrophic left kidney, findings consistent with sequelae of prior
infectious or obstructive insult.

## 2022-05-25 ENCOUNTER — Ambulatory Visit: Payer: Medicaid Other | Admitting: Nutrition

## 2022-06-06 ENCOUNTER — Ambulatory Visit: Payer: Medicaid Other | Admitting: Nutrition
